# Patient Record
Sex: Male | Born: 1935 | Race: White | Hispanic: No | Smoking: Former smoker
Health system: Southern US, Community
[De-identification: ages and names within clinical notes are randomized; demographics above are authoritative.]

## PROBLEM LIST (undated history)

## (undated) DIAGNOSIS — K219 Gastro-esophageal reflux disease without esophagitis: Secondary | ICD-10-CM

---

## 2011-01-01 DEATH — deceased

## 2020-11-14 ENCOUNTER — Inpatient Hospital Stay (HOSPITAL_COMMUNITY)
Admission: EM | Admit: 2020-11-14 | Discharge: 2020-11-17 | DRG: 064 | Disposition: A | Discharge status: Hospice | Payer: Medicare PPO | Attending: Internal Medicine | Admitting: Internal Medicine

## 2020-11-14 ENCOUNTER — Other Ambulatory Visit: Payer: Self-pay

## 2020-11-14 ENCOUNTER — Encounter (HOSPITAL_COMMUNITY): Payer: Self-pay | Admitting: *Deleted

## 2020-11-14 ENCOUNTER — Emergency Department (HOSPITAL_COMMUNITY): Payer: Medicare PPO

## 2020-11-14 ENCOUNTER — Observation Stay (HOSPITAL_COMMUNITY): Payer: Medicare PPO

## 2020-11-14 DIAGNOSIS — R7989 Other specified abnormal findings of blood chemistry: Secondary | ICD-10-CM

## 2020-11-14 DIAGNOSIS — I63512 Cerebral infarction due to unspecified occlusion or stenosis of left middle cerebral artery: Principal | ICD-10-CM | POA: Diagnosis present

## 2020-11-14 DIAGNOSIS — K219 Gastro-esophageal reflux disease without esophagitis: Secondary | ICD-10-CM | POA: Diagnosis not present

## 2020-11-14 DIAGNOSIS — Z66 Do not resuscitate: Secondary | ICD-10-CM | POA: Diagnosis present

## 2020-11-14 DIAGNOSIS — R4701 Aphasia: Secondary | ICD-10-CM | POA: Diagnosis present

## 2020-11-14 DIAGNOSIS — Z87891 Personal history of nicotine dependence: Secondary | ICD-10-CM

## 2020-11-14 DIAGNOSIS — E669 Obesity, unspecified: Secondary | ICD-10-CM | POA: Diagnosis present

## 2020-11-14 DIAGNOSIS — I639 Cerebral infarction, unspecified: Secondary | ICD-10-CM | POA: Diagnosis not present

## 2020-11-14 DIAGNOSIS — R778 Other specified abnormalities of plasma proteins: Secondary | ICD-10-CM | POA: Diagnosis present

## 2020-11-14 DIAGNOSIS — G928 Other toxic encephalopathy: Secondary | ICD-10-CM | POA: Diagnosis present

## 2020-11-14 DIAGNOSIS — Z7982 Long term (current) use of aspirin: Secondary | ICD-10-CM

## 2020-11-14 DIAGNOSIS — Z6831 Body mass index (BMI) 31.0-31.9, adult: Secondary | ICD-10-CM

## 2020-11-14 DIAGNOSIS — F419 Anxiety disorder, unspecified: Secondary | ICD-10-CM | POA: Diagnosis present

## 2020-11-14 DIAGNOSIS — J439 Emphysema, unspecified: Secondary | ICD-10-CM | POA: Diagnosis present

## 2020-11-14 DIAGNOSIS — Z20822 Contact with and (suspected) exposure to covid-19: Secondary | ICD-10-CM | POA: Diagnosis present

## 2020-11-14 DIAGNOSIS — D6489 Other specified anemias: Secondary | ICD-10-CM | POA: Diagnosis present

## 2020-11-14 DIAGNOSIS — E785 Hyperlipidemia, unspecified: Secondary | ICD-10-CM | POA: Diagnosis present

## 2020-11-14 DIAGNOSIS — I248 Other forms of acute ischemic heart disease: Secondary | ICD-10-CM | POA: Diagnosis present

## 2020-11-14 DIAGNOSIS — Z515 Encounter for palliative care: Secondary | ICD-10-CM

## 2020-11-14 DIAGNOSIS — I6523 Occlusion and stenosis of bilateral carotid arteries: Secondary | ICD-10-CM

## 2020-11-14 DIAGNOSIS — R2990 Unspecified symptoms and signs involving the nervous system: Secondary | ICD-10-CM

## 2020-11-14 DIAGNOSIS — G8191 Hemiplegia, unspecified affecting right dominant side: Secondary | ICD-10-CM | POA: Diagnosis present

## 2020-11-14 DIAGNOSIS — I10 Essential (primary) hypertension: Secondary | ICD-10-CM | POA: Diagnosis present

## 2020-11-14 DIAGNOSIS — E119 Type 2 diabetes mellitus without complications: Secondary | ICD-10-CM | POA: Diagnosis present

## 2020-11-14 HISTORY — DX: Gastro-esophageal reflux disease without esophagitis: K21.9

## 2020-11-14 LAB — COMPREHENSIVE METABOLIC PANEL
ALT: 14 U/L (ref 0–44)
AST: 31 U/L (ref 15–41)
Albumin: 3.8 g/dL (ref 3.5–5.0)
Alkaline Phosphatase: 78 U/L (ref 38–126)
Anion gap: 12 (ref 5–15)
BUN: 20 mg/dL (ref 8–23)
CO2: 23 mmol/L (ref 22–32)
Calcium: 9.3 mg/dL (ref 8.9–10.3)
Chloride: 105 mmol/L (ref 98–111)
Creatinine, Ser: 1.63 mg/dL — ABNORMAL HIGH (ref 0.61–1.24)
GFR, Estimated: 41 mL/min — ABNORMAL LOW (ref >60)
Glucose, Bld: 123 mg/dL — ABNORMAL HIGH (ref 70–99)
Potassium: 4.1 mmol/L (ref 3.5–5.1)
Sodium: 140 mmol/L (ref 135–145)
Total Bilirubin: 1.2 mg/dL (ref 0.3–1.2)
Total Protein: 7.3 g/dL (ref 6.5–8.1)

## 2020-11-14 LAB — DIFFERENTIAL
Abs Immature Granulocytes: 0.02 10*3/uL (ref 0.00–0.07)
Basophils Absolute: 0 10*3/uL (ref 0.0–0.1)
Basophils Relative: 0 %
Eosinophils Absolute: 0.3 10*3/uL (ref 0.0–0.5)
Eosinophils Relative: 4 %
Immature Granulocytes: 0 %
Lymphocytes Relative: 28 %
Lymphs Abs: 2.4 10*3/uL (ref 0.7–4.0)
Monocytes Absolute: 0.7 10*3/uL (ref 0.1–1.0)
Monocytes Relative: 9 %
Neutro Abs: 5 10*3/uL (ref 1.7–7.7)
Neutrophils Relative %: 59 %

## 2020-11-14 LAB — URINALYSIS, ROUTINE W REFLEX MICROSCOPIC
Bilirubin Urine: NEGATIVE
Glucose, UA: NEGATIVE mg/dL
Ketones, ur: NEGATIVE mg/dL
Leukocytes,Ua: NEGATIVE
Nitrite: NEGATIVE
Protein, ur: 100 mg/dL — AB
Specific Gravity, Urine: >1.03 — ABNORMAL HIGH (ref 1.005–1.030)
pH: 5.5 (ref 5.0–8.0)

## 2020-11-14 LAB — APTT: aPTT: 37 seconds — ABNORMAL HIGH (ref 24–36)

## 2020-11-14 LAB — CBC
HCT: 46.8 % (ref 39.0–52.0)
Hemoglobin: 15.2 g/dL (ref 13.0–17.0)
MCH: 30.8 pg (ref 26.0–34.0)
MCHC: 32.5 g/dL (ref 30.0–36.0)
MCV: 94.7 fL (ref 80.0–100.0)
Platelets: 191 10*3/uL (ref 150–400)
RBC: 4.94 MIL/uL (ref 4.22–5.81)
RDW: 13.5 % (ref 11.5–15.5)
WBC: 8.4 10*3/uL (ref 4.0–10.5)
nRBC: 0 % (ref 0.0–0.2)

## 2020-11-14 LAB — I-STAT CHEM 8, ED
BUN: 23 mg/dL (ref 8–23)
Calcium, Ion: 1.08 mmol/L — ABNORMAL LOW (ref 1.15–1.40)
Chloride: 107 mmol/L (ref 98–111)
Creatinine, Ser: 1.5 mg/dL — ABNORMAL HIGH (ref 0.61–1.24)
Glucose, Bld: 112 mg/dL — ABNORMAL HIGH (ref 70–99)
HCT: 47 % (ref 39.0–52.0)
Hemoglobin: 16 g/dL (ref 13.0–17.0)
Potassium: 4 mmol/L (ref 3.5–5.1)
Sodium: 140 mmol/L (ref 135–145)
TCO2: 21 mmol/L — ABNORMAL LOW (ref 22–32)

## 2020-11-14 LAB — SODIUM, URINE, RANDOM: Sodium, Ur: 137 mmol/L

## 2020-11-14 LAB — TROPONIN I (HIGH SENSITIVITY)
Troponin I (High Sensitivity): 292 ng/L (ref <18)
Troponin I (High Sensitivity): 300 ng/L (ref <18)

## 2020-11-14 LAB — AMMONIA: Ammonia: 26 umol/L (ref 9–35)

## 2020-11-14 LAB — BRAIN NATRIURETIC PEPTIDE: B Natriuretic Peptide: 480.2 pg/mL — ABNORMAL HIGH (ref 0.0–100.0)

## 2020-11-14 LAB — URINALYSIS, MICROSCOPIC (REFLEX)
Bacteria, UA: NONE SEEN
Squamous Epithelial / HPF: NONE SEEN (ref 0–5)

## 2020-11-14 LAB — SARS CORONAVIRUS 2 (TAT 6-24 HRS): SARS Coronavirus 2: NEGATIVE

## 2020-11-14 LAB — CBG MONITORING, ED: Glucose-Capillary: 116 mg/dL — ABNORMAL HIGH (ref 70–99)

## 2020-11-14 LAB — PROTIME-INR
INR: 1.1 (ref 0.8–1.2)
Prothrombin Time: 13.8 seconds (ref 11.4–15.2)

## 2020-11-14 LAB — CREATININE, URINE, RANDOM: Creatinine, Urine: 217.07 mg/dL

## 2020-11-14 IMAGING — MR MR MRA HEAD W/O CM
2 series · 14 of 48 positions shown · non-contrast
Comparison: Noncontrast head CT performed earlier today [DATE].

CLINICAL DATA: Neuro deficit, acute, stroke suspected.

EXAM:
MR HEAD WITHOUT CONTRAST
MR CIRCLE OF WILLIS WITHOUT CONTRAST
MRA OF THE NECK WITHOUT CONTRAST
TECHNIQUE: Multiplanar, multiecho pulse sequences of the brain, circle of
willis and surrounding structures were obtained without intravenous
contrast. Angiographic images of the neck were obtained using MRA
technique without intravenous contrast.

[Series 5: 3d cow · axial · 0.5mm · 0.41mm/px · z∈[-53,+43]mm · 13 of 220 slices shown]
[im 1/220]
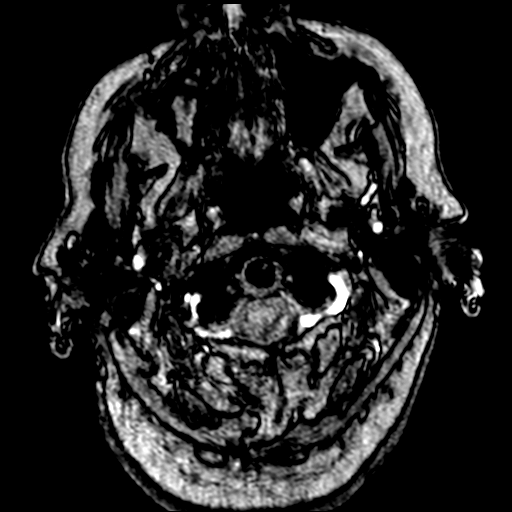
[im 5/220]
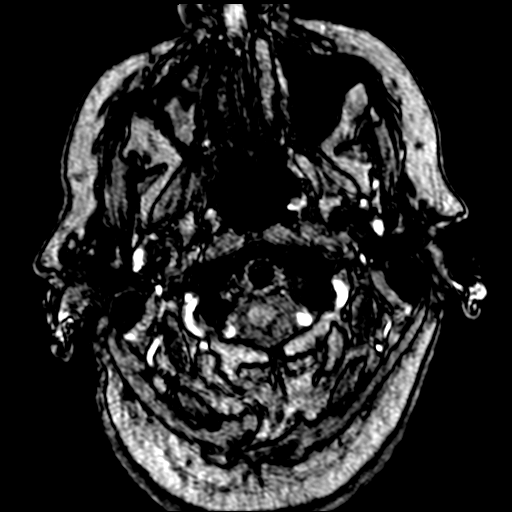
[im 10/220]
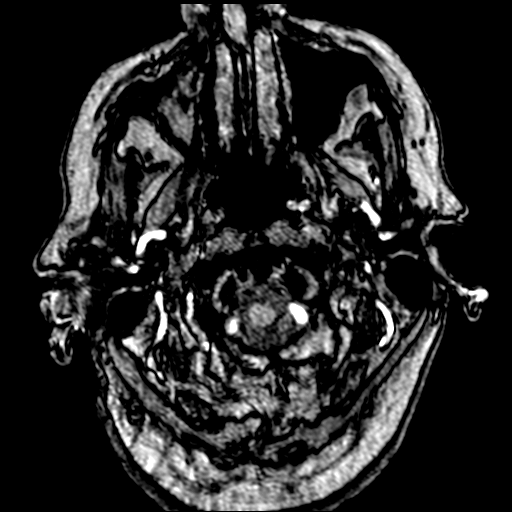
[im 34/220]
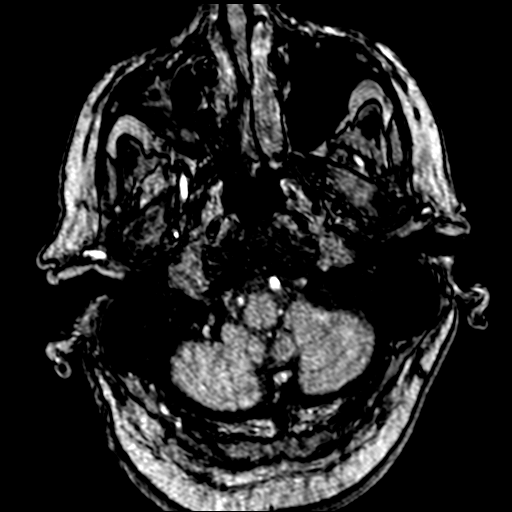
[im 39/220]
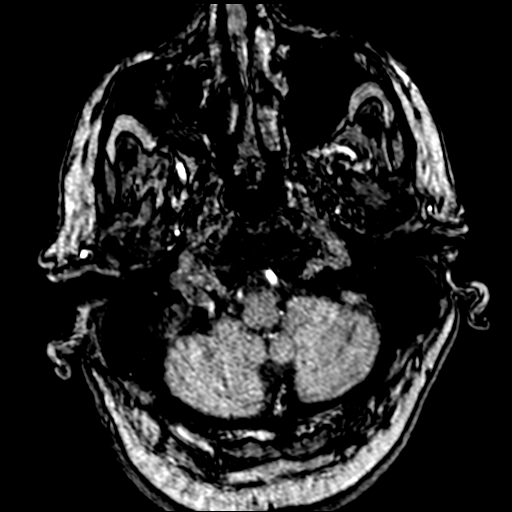
[im 67/220]
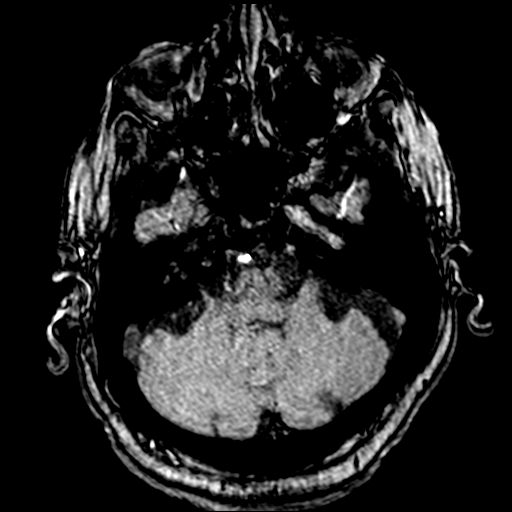
[im 96/220]
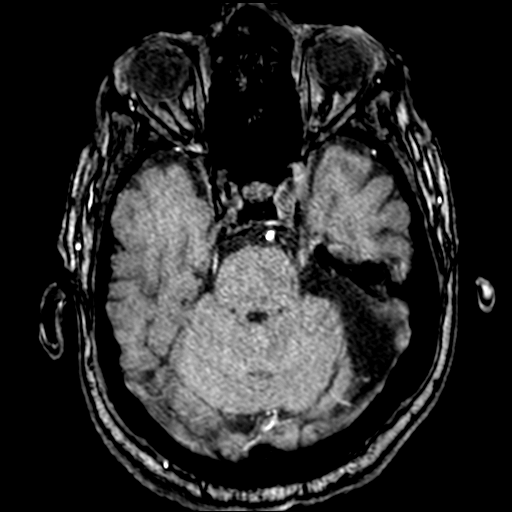
[im 110/220]
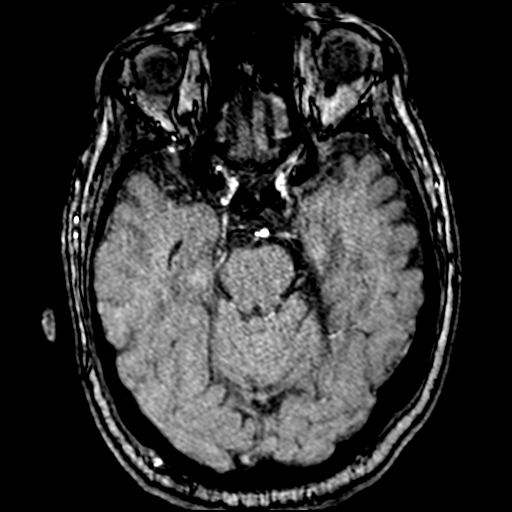
[im 124/220]
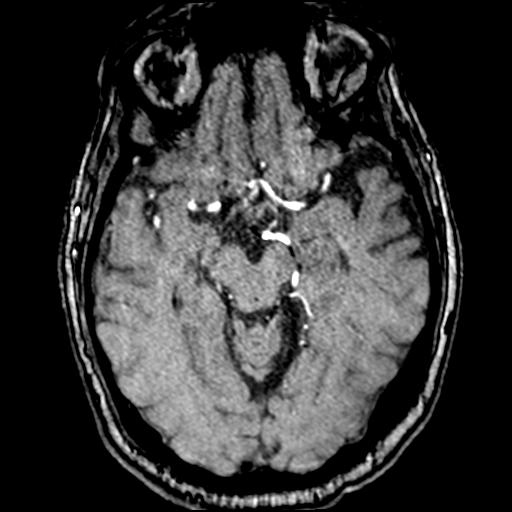
[im 153/220]
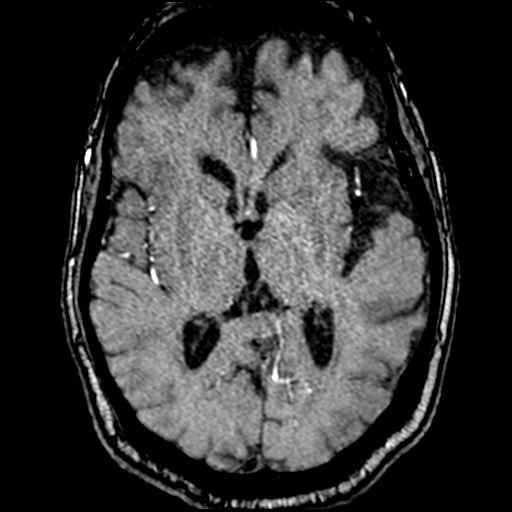
[im 181/220]
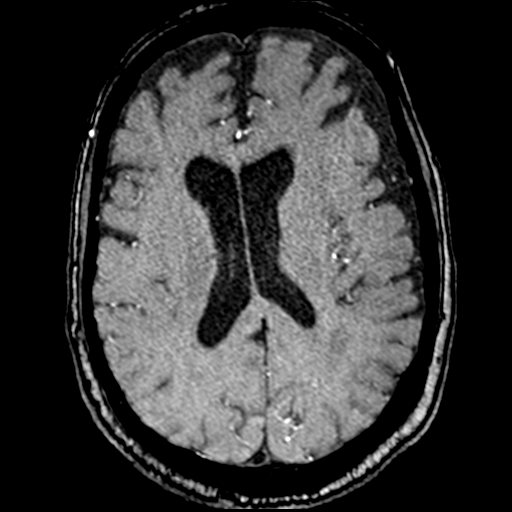
[im 186/220]
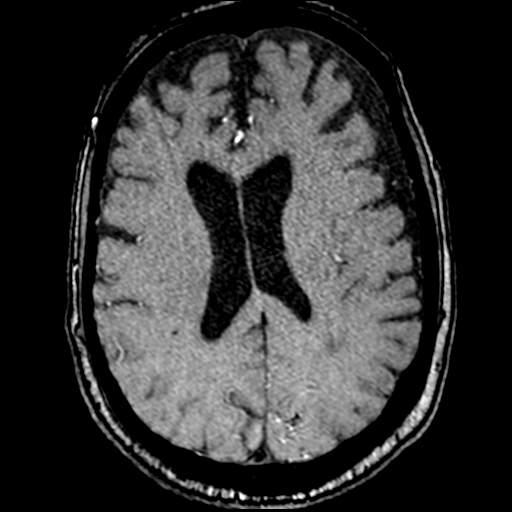
[im 210/220]
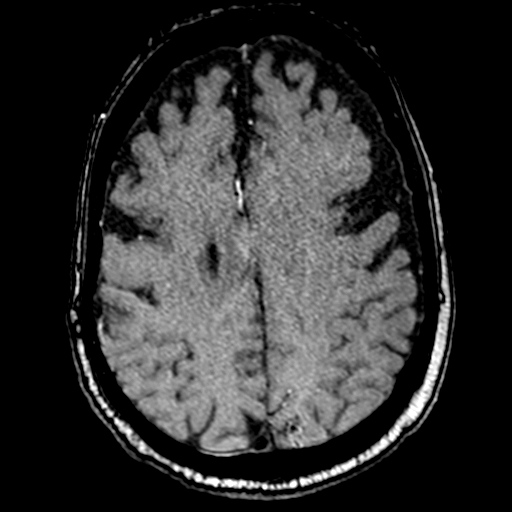

[Series 1060: mip tumble · axial · 0.5mm · 0.15mm/px · 1 of 1 slices shown]
[im 1/1]
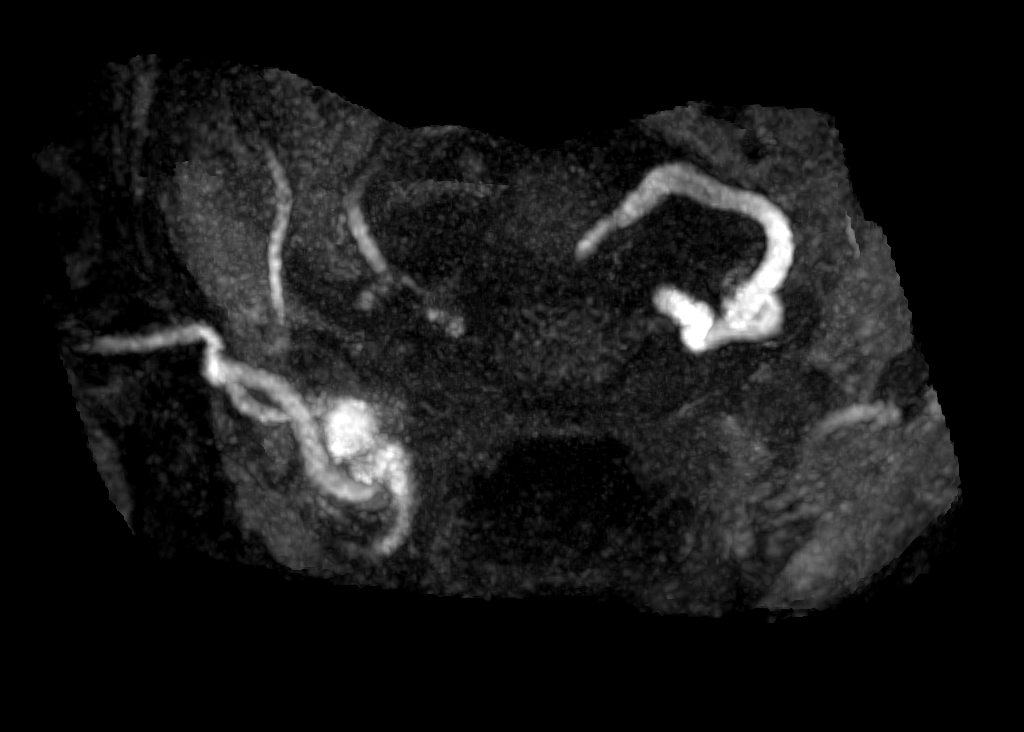

[14 of 48 positions shown; findings below may reference images not displayed]

FINDINGS: MR HEAD FINDINGS

Brain:

Intermittently motion degraded examination. Most notably, there is
mild-to-moderate motion degradation of the coronal T2 weighted
sequence.

Mild-to-moderate cerebral atrophy.

Small acute cortical infarct within the left parietal lobe (series
5, image 9).

Additional small curvilinear acute infarct within the left parietal
white matter (for instance as seen on series 5, image 85).

There are a few additional punctate acute infarcts within the left
frontal lobe white matter (for instance as seen on series 5, image
86).

Mild multifocal T2/FLAIR hyperintensity within the cerebral white
matter and pons is nonspecific, but compatible with chronic small
vessel ischemic disease.

Tiny chronic infarct within the right cerebellar hemisphere.

Redemonstrated asymmetric CSF intensity prominence overlying the
left frontal lobe. However, this appears to reflect asymmetric
prominence of the subarachnoid space rather than a subdural
collection. Unchanged 4 mm rightward midline shift, indeterminate in
etiology.

No evidence of intracranial mass.

No chronic intracranial blood products.

Vascular: Suspected left parietal lobe developmental venous anomaly
(series 18, image 41). Otherwise reported below.

Skull and upper cervical spine: No focal marrow lesion.

Sinuses/Orbits: Visualized orbits show no acute finding. Moderate
right maxillary sinus mucosal thickening with associated chronic
reactive osteitis. Trace bilateral ethmoid sinus mucosal thickening.

MR CIRCLE OF WILLIS FINDINGS

The intracranial internal carotid arteries remain occluded or near
occluded throughout the siphon region. Reconstitution of flow
related signal within the supraclinoid internal carotid arteries
bilaterally. The M1 middle cerebral arteries are patent. Attenuated
appearance of multiple proximal left M2 MCA branches. However, no
left M2 proximal branch occlusion is identified. The anterior
cerebral arteries are patent. Moderate stenosis within the A1 right
ACA. No intracranial aneurysm is identified.

The intracranial vertebral arteries are patent. The basilar artery
is patent. Possible short segment fenestration within the proximal
basilar artery. The posterior cerebral arteries are patent. Fetal
origin right posterior cerebral artery. A small left posterior
communicating artery is present.

MRA NECK FINDINGS

Mild intermittent motion degradation.

The right CCA is patent to the bifurcation without significant
stenosis. The right ICA is poorly delineated shortly beyond its
origin, likely occluded or near occluded. The left CCA is poorly
delineated shortly beyond its origin, likely occluded or near
occluded. The left ICA is nonvisualized, likely occluded or near
occluded.

The dominant left vertebral artery is patent within the neck.
Apparent moderate stenosis at the origin of this vessel. Flow
related signal is seen intermittently within the non dominant
cervical right vertebral artery.

These results were called by telephone at the time of interpretation
on [DATE] at [DATE] to provider Dr. KINOLLI, who verbally
acknowledged these results.
IMPRESSION: MRI brain:

1. Intermittently motion degraded examination.
2. Small acute infarcts within the left parietal cortex and left
frontoparietal white matter, as described.
3. Background mild-to-moderate cerebral atrophy and mild chronic
small vessel ischemic disease.
4. Tiny chronic right cerebellar infarct.
5. Redemonstrated asymmetric CSF intensity prominence overlying the
left frontal lobe. This appears to reflect asymmetric prominence of
the subarachnoid space rather than a subdural collection.

MRA neck:

1. The right ICA is poorly delineated shortly beyond its origin,
likely occluded or near occluded.
2. The left CCA is nonvisualized except for its very proximal
portion. The cervical left ICA is also nonvisualized. These vessels
are likely occluded or near occluded.
3. The dominant left vertebral artery is patent within the neck.
Apparent moderate stenosis at the origin of this vessel. Flow
related signal is seen intermittently within the non-dominant
cervical right vertebral artery.

MRA head:

1. The intracranial internal carotid arteries remain occluded or
near occluded throughout the siphon region. Reconstitution of the
intracranial ICAs at the supraclinoid level.
2. Attenuated appearance of multiple proximal M2 left MCA branches.
However, no left M2 proximal branch occlusion is identified.
3. Moderate stenosis within the A1 right ACA.

## 2020-11-14 IMAGING — MR MR HEAD W/O CM
12 of 13 series · 44 of 48 positions shown · non-contrast
Comparison: Noncontrast head CT performed earlier today [DATE].

CLINICAL DATA: Neuro deficit, acute, stroke suspected.

EXAM:
MR HEAD WITHOUT CONTRAST
MR CIRCLE OF WILLIS WITHOUT CONTRAST
MRA OF THE NECK WITHOUT CONTRAST
TECHNIQUE: Multiplanar, multiecho pulse sequences of the brain, circle of
willis and surrounding structures were obtained without intravenous
contrast. Angiographic images of the neck were obtained using MRA
technique without intravenous contrast.

[Series 5: DWI · axial · 3.0mm · 0.88mm/px · z∈[-45,+91]mm · 8 of 104 slices shown (1 of 4)]
[im 1/104]
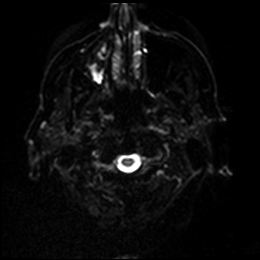
[im 15/104]
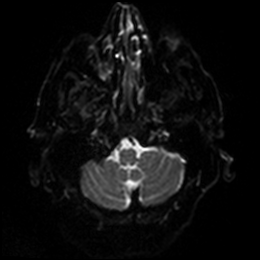
[im 30/104]
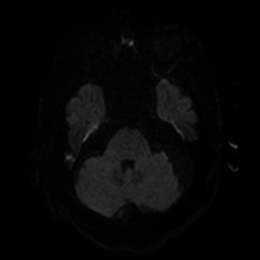
[im 45/104]
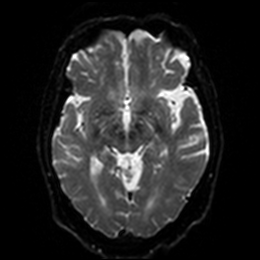
[im 59/104]
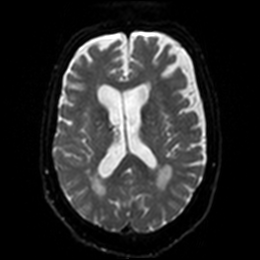
[im 74/104]
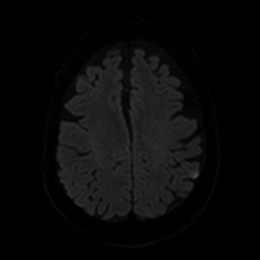
[im 89/104]
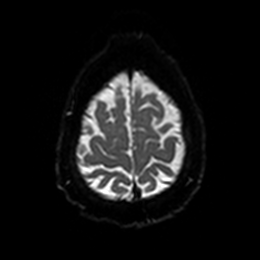
[im 104/104]
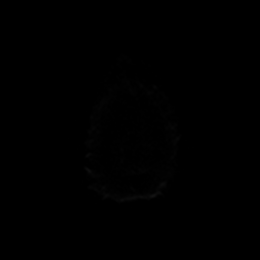

[Series 6: DWI · axial · 3.0mm · 0.88mm/px · z∈[-45,+91]mm · 4 of 52 slices shown (2 of 4)]
[im 1/52]
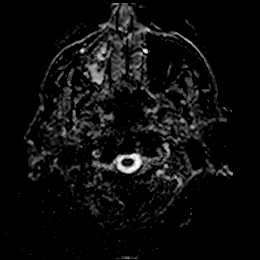
[im 18/52]
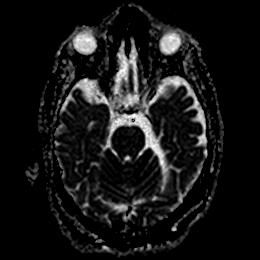
[im 35/52]
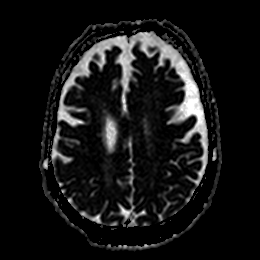
[im 52/52]
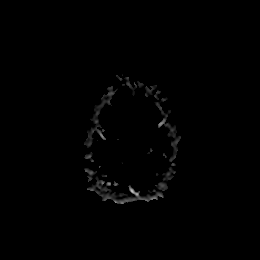

[Series 7: T1 · sagittal · 5.0mm · 0.75mm/px · 2 of 27 slices shown]
[im 1/27]
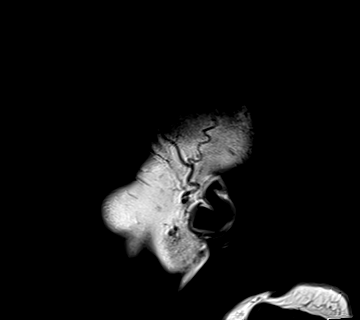
[im 27/27]
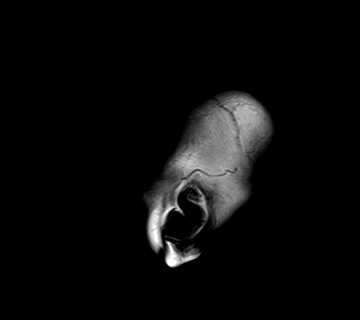

[Series 8: DWI · coronal · 4.0mm · 0.88mm/px · 5 of 72 slices shown (3 of 4)]
[im 1/72]
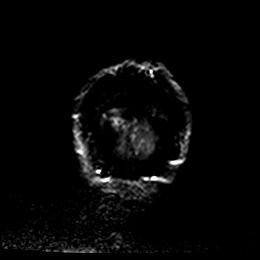
[im 18/72]
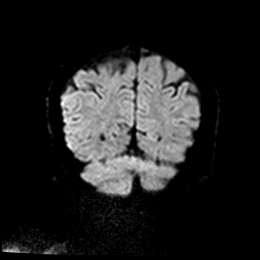
[im 36/72]
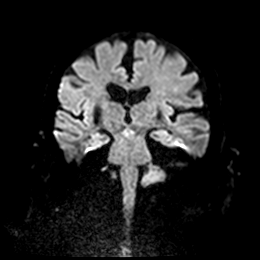
[im 54/72]
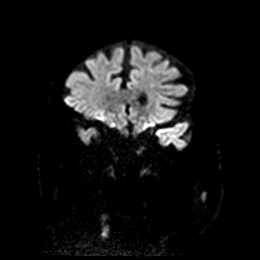
[im 72/72]
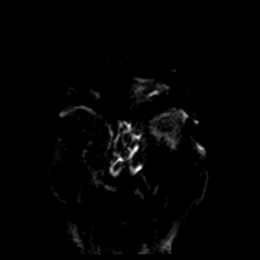

[Series 9: DWI · coronal · 4.0mm · 0.88mm/px · 3 of 36 slices shown (4 of 4)]
[im 1/36]
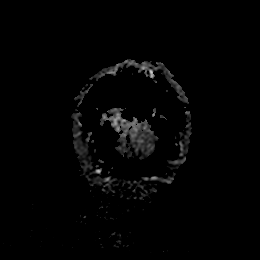
[im 18/36]
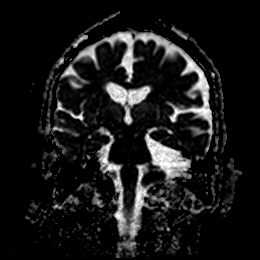
[im 36/36]
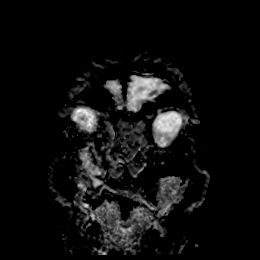

[Series 10: T2 · axial · 5.0mm · 0.72mm/px · z∈[-43,+96]mm · 2 of 27 slices shown (1 of 2)]
[im 1/27]
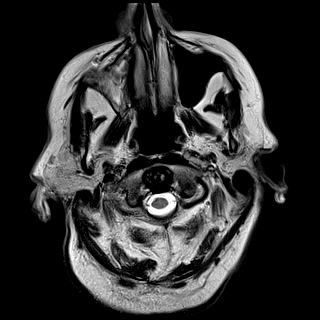
[im 27/27]
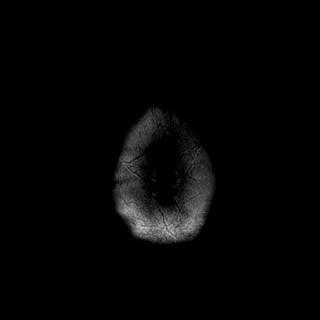

[Series 11: FLAIR · axial · 5.0mm · 0.45mm/px · z∈[-41,+98]mm · 2 of 27 slices shown]
[im 1/27]
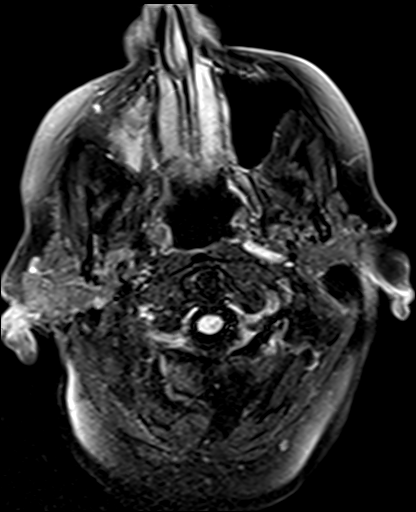
[im 27/27]
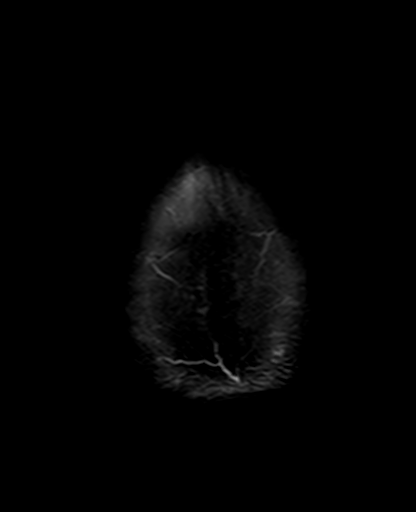

[Series 16: mag_images · axial · 3.0mm · 0.90mm/px · z∈[-46,+101]mm · 4 of 56 slices shown]
[im 1/56]
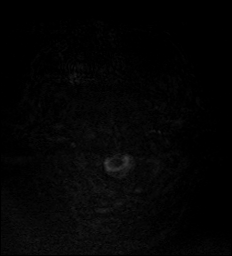
[im 19/56]
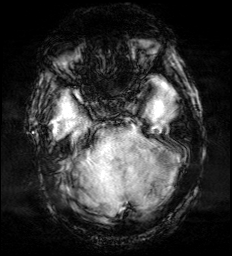
[im 37/56]
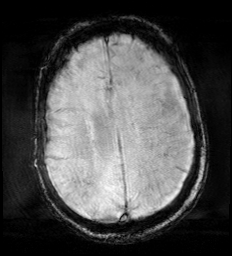
[im 56/56]
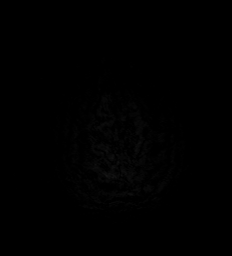

[Series 17: pha_images · axial · 3.0mm · 0.90mm/px · z∈[-46,+101]mm · 4 of 55 slices shown]
[im 1/55]
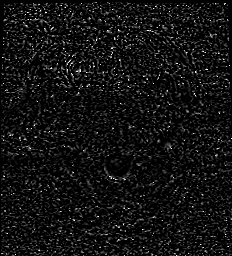
[im 19/55]
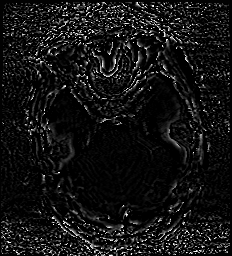
[im 37/55]
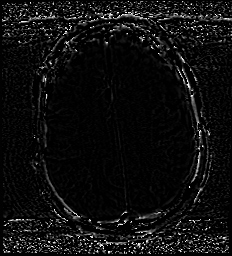
[im 55/55]
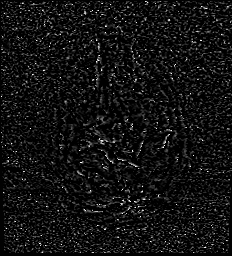

[Series 18: swi_images · axial · 3.0mm · 0.90mm/px · z∈[-46,+101]mm · 4 of 56 slices shown]
[im 1/56]
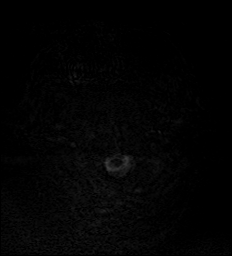
[im 19/56]
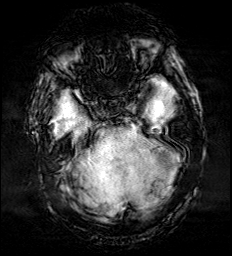
[im 37/56]
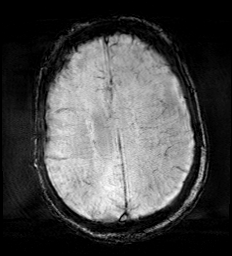
[im 56/56]
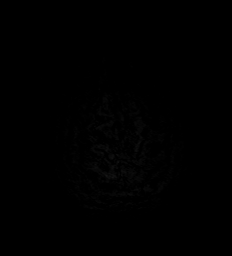

[Series 19: mip_images(sw) · axial · 24.0mm · 0.90mm/px · z∈[-37,+92]mm · 4 of 49 slices shown]
[im 1/49]
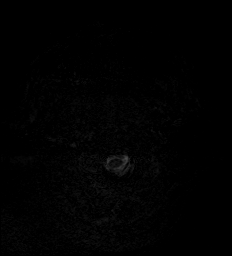
[im 17/49]
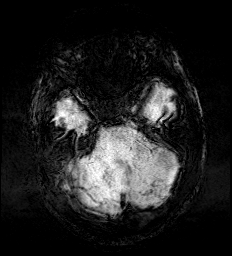
[im 33/49]
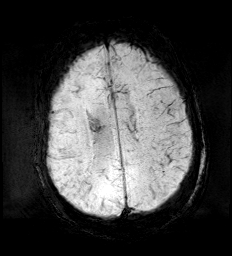
[im 49/49]
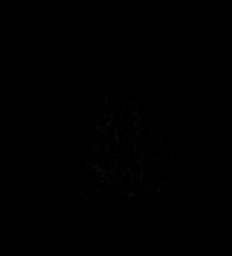

[Series 21: T2 · coronal · 5.0mm · 0.72mm/px · 2 of 33 slices shown (2 of 2)]
[im 1/33]
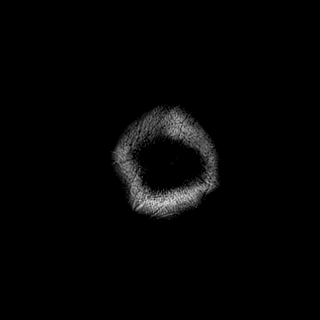
[im 33/33]
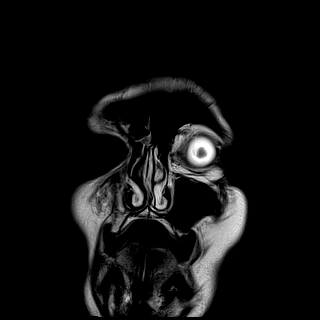

[44 of 48 positions shown; findings below may reference images not displayed]

FINDINGS: MR HEAD FINDINGS

Brain:

Intermittently motion degraded examination. Most notably, there is
mild-to-moderate motion degradation of the coronal T2 weighted
sequence.

Mild-to-moderate cerebral atrophy.

Small acute cortical infarct within the left parietal lobe (series
5, image 9).

Additional small curvilinear acute infarct within the left parietal
white matter (for instance as seen on series 5, image 85).

There are a few additional punctate acute infarcts within the left
frontal lobe white matter (for instance as seen on series 5, image
86).

Mild multifocal T2/FLAIR hyperintensity within the cerebral white
matter and pons is nonspecific, but compatible with chronic small
vessel ischemic disease.

Tiny chronic infarct within the right cerebellar hemisphere.

Redemonstrated asymmetric CSF intensity prominence overlying the
left frontal lobe. However, this appears to reflect asymmetric
prominence of the subarachnoid space rather than a subdural
collection. Unchanged 4 mm rightward midline shift, indeterminate in
etiology.

No evidence of intracranial mass.

No chronic intracranial blood products.

Vascular: Suspected left parietal lobe developmental venous anomaly
(series 18, image 41). Otherwise reported below.

Skull and upper cervical spine: No focal marrow lesion.

Sinuses/Orbits: Visualized orbits show no acute finding. Moderate
right maxillary sinus mucosal thickening with associated chronic
reactive osteitis. Trace bilateral ethmoid sinus mucosal thickening.

MR CIRCLE OF WILLIS FINDINGS

The intracranial internal carotid arteries remain occluded or near
occluded throughout the siphon region. Reconstitution of flow
related signal within the supraclinoid internal carotid arteries
bilaterally. The M1 middle cerebral arteries are patent. Attenuated
appearance of multiple proximal left M2 MCA branches. However, no
left M2 proximal branch occlusion is identified. The anterior
cerebral arteries are patent. Moderate stenosis within the A1 right
ACA. No intracranial aneurysm is identified.

The intracranial vertebral arteries are patent. The basilar artery
is patent. Possible short segment fenestration within the proximal
basilar artery. The posterior cerebral arteries are patent. Fetal
origin right posterior cerebral artery. A small left posterior
communicating artery is present.

MRA NECK FINDINGS

Mild intermittent motion degradation.

The right CCA is patent to the bifurcation without significant
stenosis. The right ICA is poorly delineated shortly beyond its
origin, likely occluded or near occluded. The left CCA is poorly
delineated shortly beyond its origin, likely occluded or near
occluded. The left ICA is nonvisualized, likely occluded or near
occluded.

The dominant left vertebral artery is patent within the neck.
Apparent moderate stenosis at the origin of this vessel. Flow
related signal is seen intermittently within the non dominant
cervical right vertebral artery.

These results were called by telephone at the time of interpretation
on [DATE] at [DATE] to provider Dr. KINOLLI, who verbally
acknowledged these results.
IMPRESSION: MRI brain:

1. Intermittently motion degraded examination.
2. Small acute infarcts within the left parietal cortex and left
frontoparietal white matter, as described.
3. Background mild-to-moderate cerebral atrophy and mild chronic
small vessel ischemic disease.
4. Tiny chronic right cerebellar infarct.
5. Redemonstrated asymmetric CSF intensity prominence overlying the
left frontal lobe. This appears to reflect asymmetric prominence of
the subarachnoid space rather than a subdural collection.

MRA neck:

1. The right ICA is poorly delineated shortly beyond its origin,
likely occluded or near occluded.
2. The left CCA is nonvisualized except for its very proximal
portion. The cervical left ICA is also nonvisualized. These vessels
are likely occluded or near occluded.
3. The dominant left vertebral artery is patent within the neck.
Apparent moderate stenosis at the origin of this vessel. Flow
related signal is seen intermittently within the non-dominant
cervical right vertebral artery.

MRA head:

1. The intracranial internal carotid arteries remain occluded or
near occluded throughout the siphon region. Reconstitution of the
intracranial ICAs at the supraclinoid level.
2. Attenuated appearance of multiple proximal M2 left MCA branches.
However, no left M2 proximal branch occlusion is identified.
3. Moderate stenosis within the A1 right ACA.

## 2020-11-14 IMAGING — MR MR MRA NECK W/O CM
5 of 9 series · 32 of 48 positions shown · non-contrast
Comparison: Noncontrast head CT performed earlier today [DATE].

CLINICAL DATA: Neuro deficit, acute, stroke suspected.

EXAM:
MR HEAD WITHOUT CONTRAST
MR CIRCLE OF WILLIS WITHOUT CONTRAST
MRA OF THE NECK WITHOUT CONTRAST
TECHNIQUE: Multiplanar, multiecho pulse sequences of the brain, circle of
willis and surrounding structures were obtained without intravenous
contrast. Angiographic images of the neck were obtained using MRA
technique without intravenous contrast.

[Series 18: tof_fl2d_tra_mip_tra · axial · 240.6mm · 0.78mm/px · 1 of 1 slices shown]
[im 1/1]
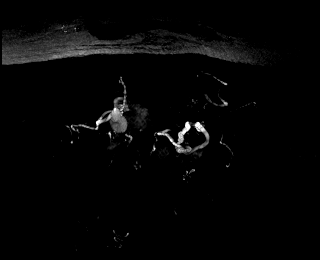

[Series 21: tof_fl2d_tra_mip_radials · coronal · 0.78mm/px · 1 of 8 slices shown]
[im 1/8]
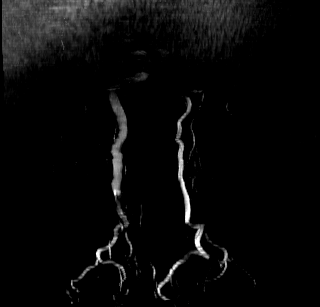

[Series 22: T1 fat-sat · axial · 2.0mm · 0.39mm/px · z∈[-151,-87]mm · 4 of 30 slices shown]
[im 1/30]
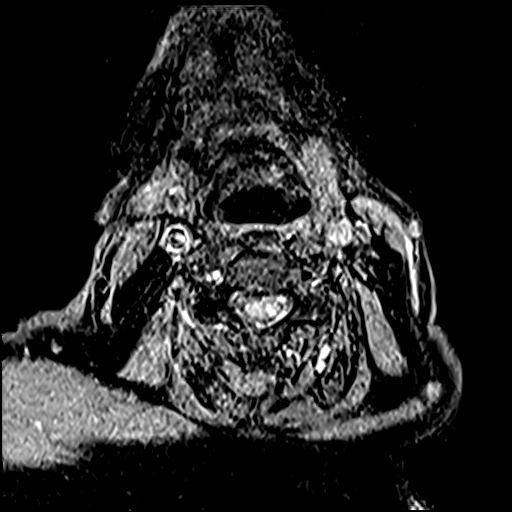
[im 10/30]
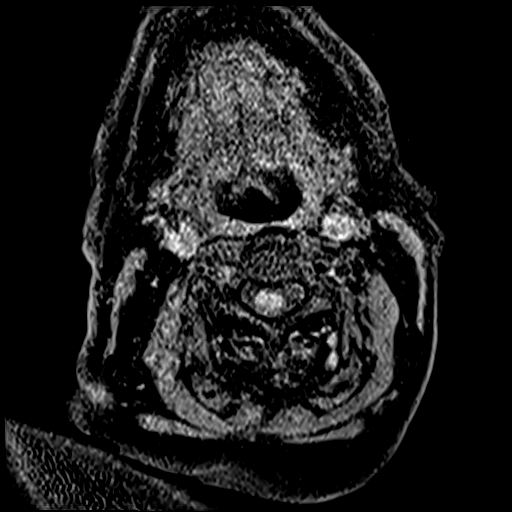
[im 20/30]
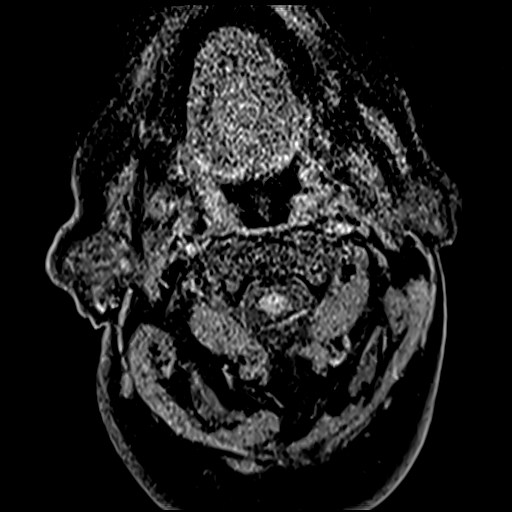
[im 30/30]
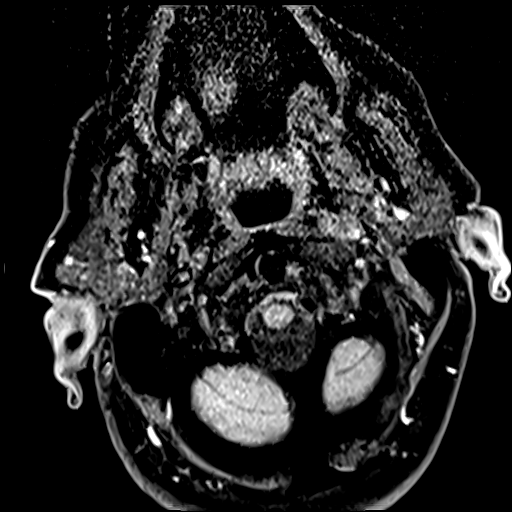

[Series 28: tof_fl3d_tra_iso · axial · 0.6mm · 0.52mm/px · z∈[-144,-70]mm · 18 of 133 slices shown]
[im 1/133]
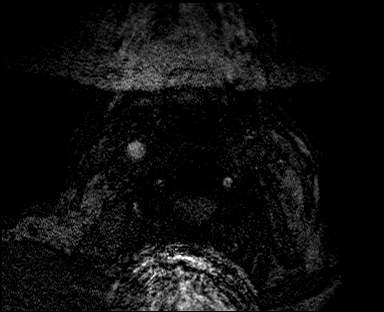
[im 7/133]
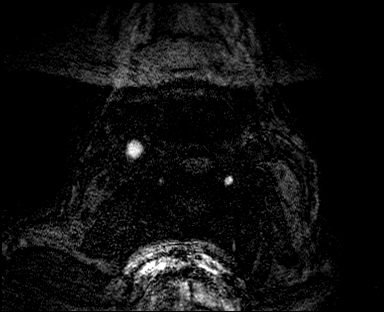
[im 14/133]
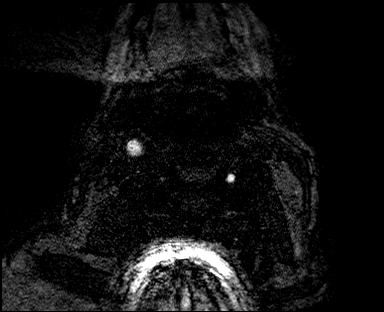
[im 20/133]
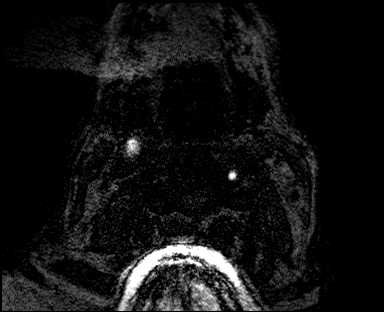
[im 27/133]
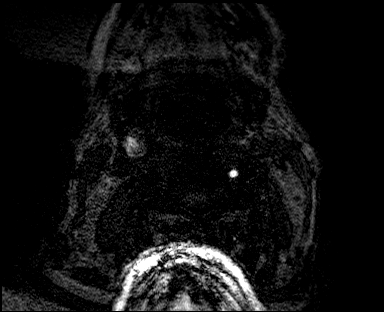
[im 34/133]
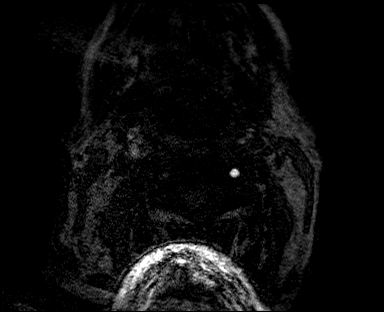
[im 40/133]
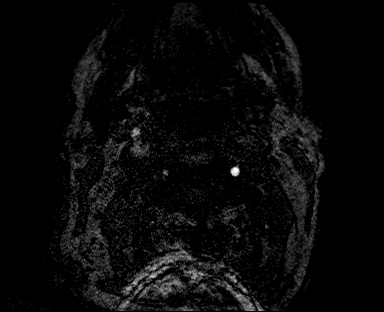
[im 47/133]
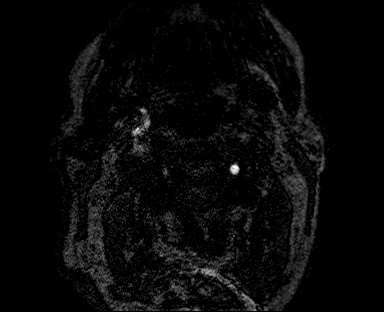
[im 53/133]
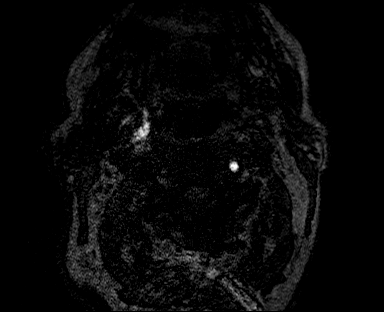
[im 60/133]
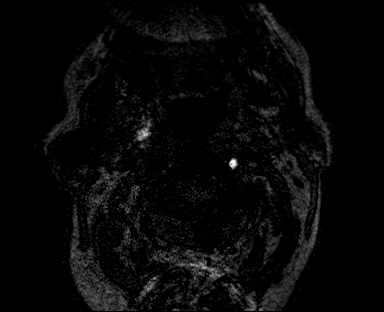
[im 67/133]
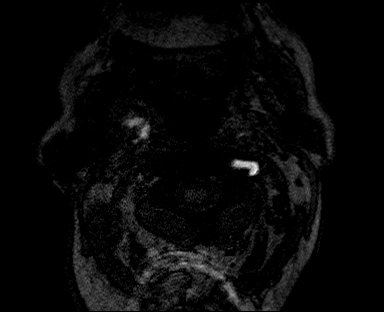
[im 73/133]
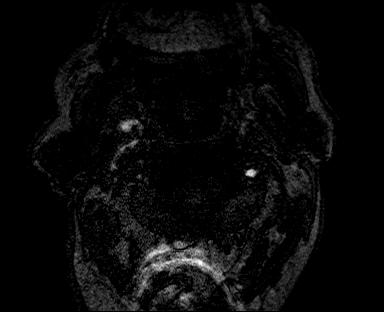
[im 80/133]
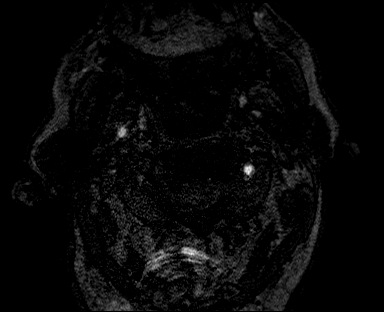
[im 86/133]
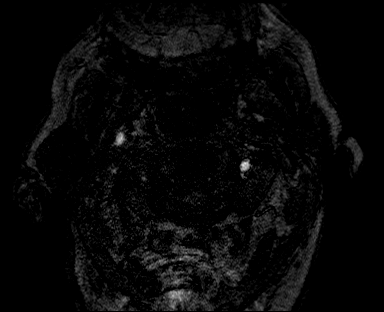
[im 93/133]
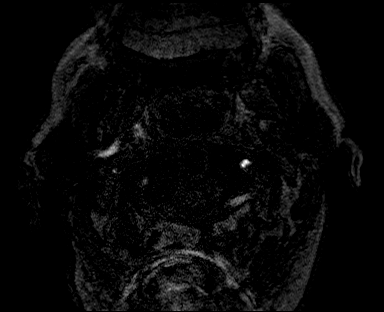
[im 100/133]
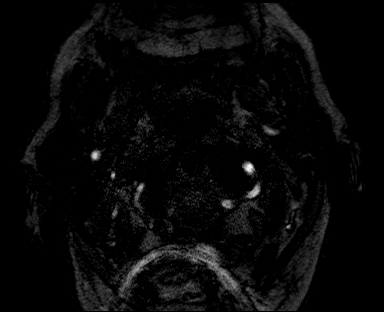
[im 113/133]
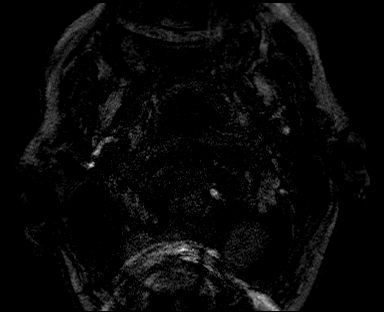
[im 126/133]
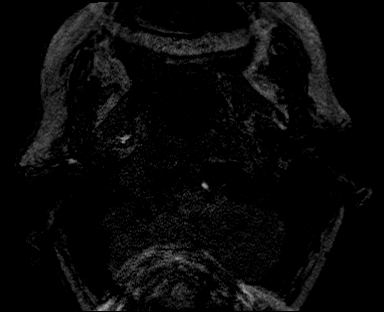

[Series 31: tof_fl2d_tra · axial · 3.0mm · 0.78mm/px · z∈[-280,-60]mm · 8 of 100 slices shown]
[im 7/100]
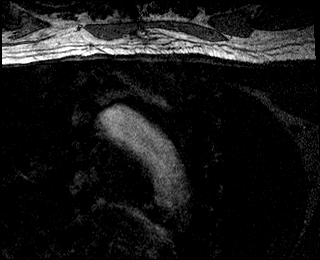
[im 20/100]
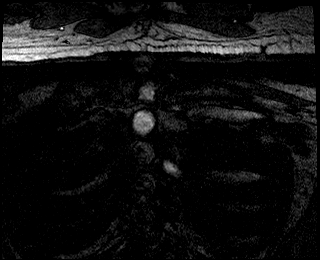
[im 34/100]
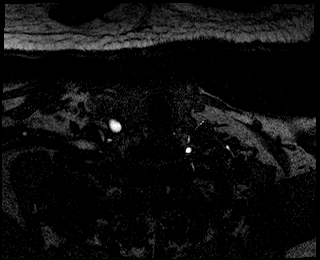
[im 47/100]
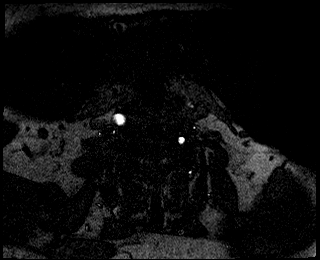
[im 60/100]
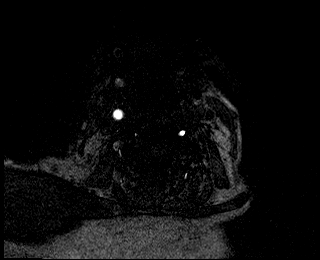
[im 73/100]
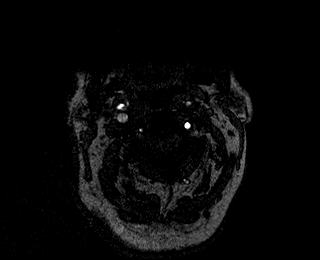
[im 86/100]
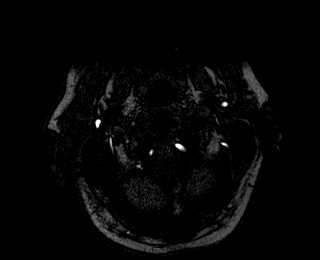
[im 100/100]
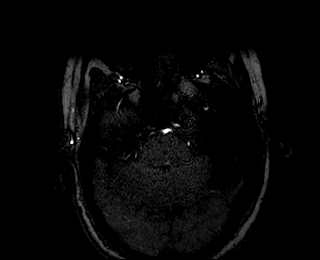

[32 of 48 positions shown; findings below may reference images not displayed]

FINDINGS: MR HEAD FINDINGS

Brain:

Intermittently motion degraded examination. Most notably, there is
mild-to-moderate motion degradation of the coronal T2 weighted
sequence.

Mild-to-moderate cerebral atrophy.

Small acute cortical infarct within the left parietal lobe (series
5, image 9).

Additional small curvilinear acute infarct within the left parietal
white matter (for instance as seen on series 5, image 85).

There are a few additional punctate acute infarcts within the left
frontal lobe white matter (for instance as seen on series 5, image
86).

Mild multifocal T2/FLAIR hyperintensity within the cerebral white
matter and pons is nonspecific, but compatible with chronic small
vessel ischemic disease.

Tiny chronic infarct within the right cerebellar hemisphere.

Redemonstrated asymmetric CSF intensity prominence overlying the
left frontal lobe. However, this appears to reflect asymmetric
prominence of the subarachnoid space rather than a subdural
collection. Unchanged 4 mm rightward midline shift, indeterminate in
etiology.

No evidence of intracranial mass.

No chronic intracranial blood products.

Vascular: Suspected left parietal lobe developmental venous anomaly
(series 18, image 41). Otherwise reported below.

Skull and upper cervical spine: No focal marrow lesion.

Sinuses/Orbits: Visualized orbits show no acute finding. Moderate
right maxillary sinus mucosal thickening with associated chronic
reactive osteitis. Trace bilateral ethmoid sinus mucosal thickening.

MR CIRCLE OF WILLIS FINDINGS

The intracranial internal carotid arteries remain occluded or near
occluded throughout the siphon region. Reconstitution of flow
related signal within the supraclinoid internal carotid arteries
bilaterally. The M1 middle cerebral arteries are patent. Attenuated
appearance of multiple proximal left M2 MCA branches. However, no
left M2 proximal branch occlusion is identified. The anterior
cerebral arteries are patent. Moderate stenosis within the A1 right
ACA. No intracranial aneurysm is identified.

The intracranial vertebral arteries are patent. The basilar artery
is patent. Possible short segment fenestration within the proximal
basilar artery. The posterior cerebral arteries are patent. Fetal
origin right posterior cerebral artery. A small left posterior
communicating artery is present.

MRA NECK FINDINGS

Mild intermittent motion degradation.

The right CCA is patent to the bifurcation without significant
stenosis. The right ICA is poorly delineated shortly beyond its
origin, likely occluded or near occluded. The left CCA is poorly
delineated shortly beyond its origin, likely occluded or near
occluded. The left ICA is nonvisualized, likely occluded or near
occluded.

The dominant left vertebral artery is patent within the neck.
Apparent moderate stenosis at the origin of this vessel. Flow
related signal is seen intermittently within the non dominant
cervical right vertebral artery.

These results were called by telephone at the time of interpretation
on [DATE] at [DATE] to provider Dr. KINOLLI, who verbally
acknowledged these results.
IMPRESSION: MRI brain:

1. Intermittently motion degraded examination.
2. Small acute infarcts within the left parietal cortex and left
frontoparietal white matter, as described.
3. Background mild-to-moderate cerebral atrophy and mild chronic
small vessel ischemic disease.
4. Tiny chronic right cerebellar infarct.
5. Redemonstrated asymmetric CSF intensity prominence overlying the
left frontal lobe. This appears to reflect asymmetric prominence of
the subarachnoid space rather than a subdural collection.

MRA neck:

1. The right ICA is poorly delineated shortly beyond its origin,
likely occluded or near occluded.
2. The left CCA is nonvisualized except for its very proximal
portion. The cervical left ICA is also nonvisualized. These vessels
are likely occluded or near occluded.
3. The dominant left vertebral artery is patent within the neck.
Apparent moderate stenosis at the origin of this vessel. Flow
related signal is seen intermittently within the non-dominant
cervical right vertebral artery.

MRA head:

1. The intracranial internal carotid arteries remain occluded or
near occluded throughout the siphon region. Reconstitution of the
intracranial ICAs at the supraclinoid level.
2. Attenuated appearance of multiple proximal M2 left MCA branches.
However, no left M2 proximal branch occlusion is identified.
3. Moderate stenosis within the A1 right ACA.

## 2020-11-14 IMAGING — CT CT HEAD CODE STROKE
3 series · 14 of 47 positions shown, 16 images · non-contrast
Comparison: No pertinent prior exams available for comparison.

CLINICAL DATA: Code stroke. Neuro deficit, acute, stroke suspected.
Additional history provided: Left eye blurriness, right arm sensory
and coordination.

EXAM:
CT HEAD WITHOUT CONTRAST
TECHNIQUE: Contiguous axial images were obtained from the base of the skull
through the vertex without intravenous contrast.

[Series 2: head 5.0 st · axial · 0.52mm/px · z∈[-85,+65]mm · 8 of 36 slices shown, 10 images]
[im 3/36  brain]
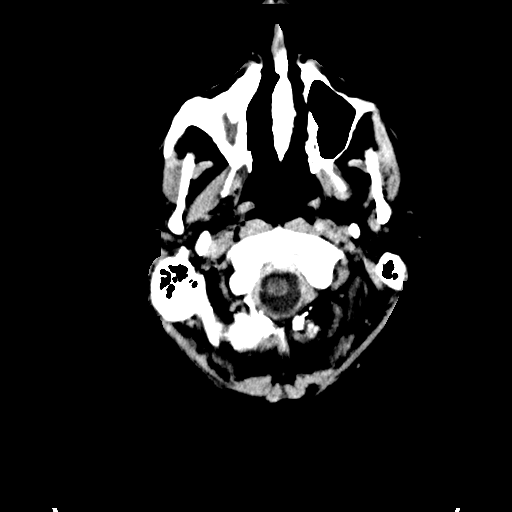
[im 3/36  bone]
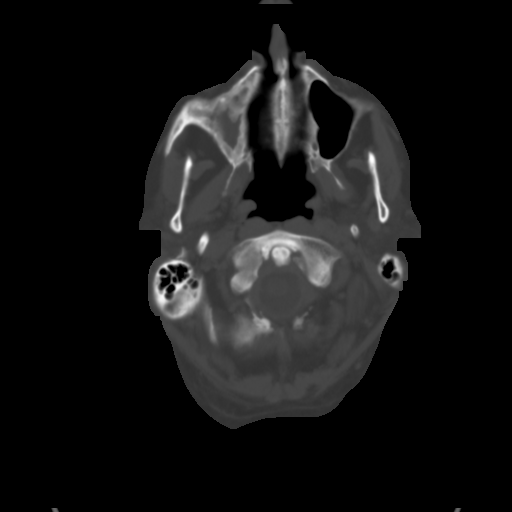
[im 8/36  brain]
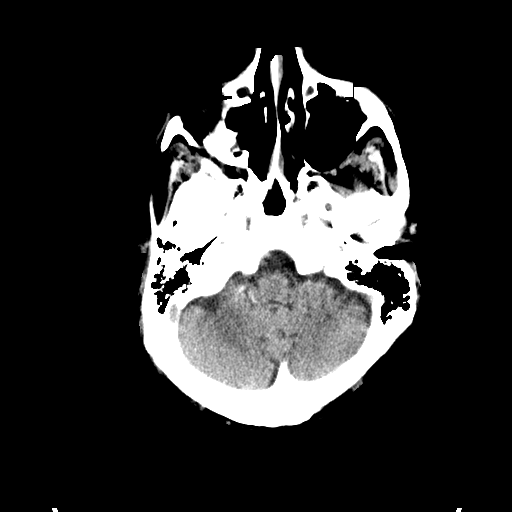
[im 11/36  brain]
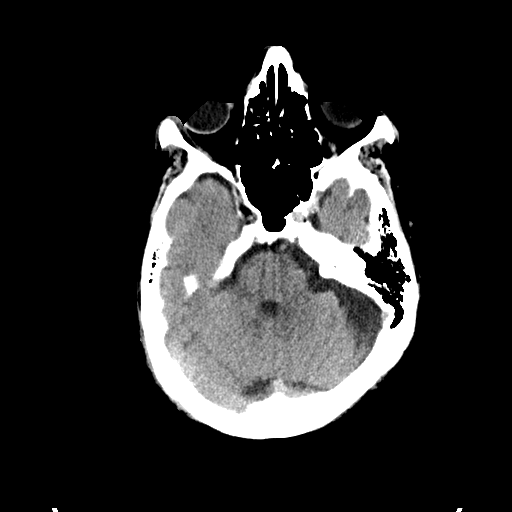
[im 16/36  brain]
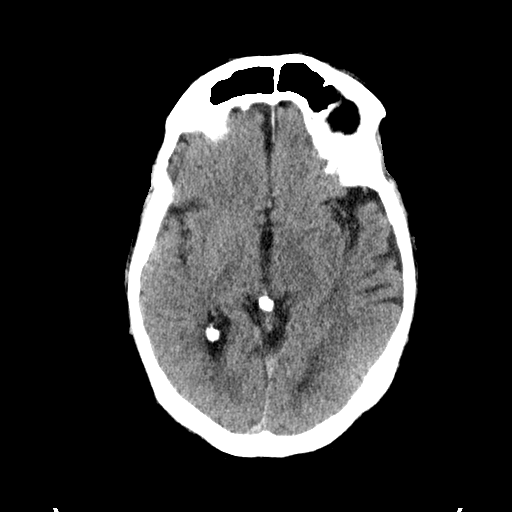
[im 20/36  brain]
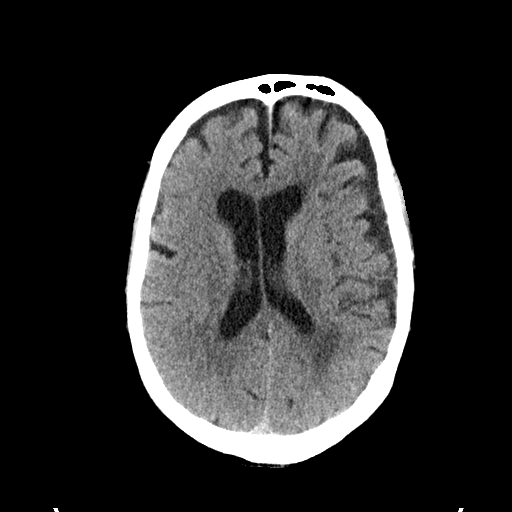
[im 20/36  bone]
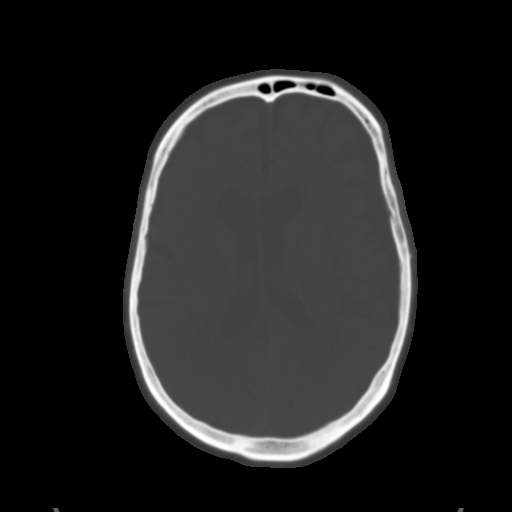
[im 25/36  brain]
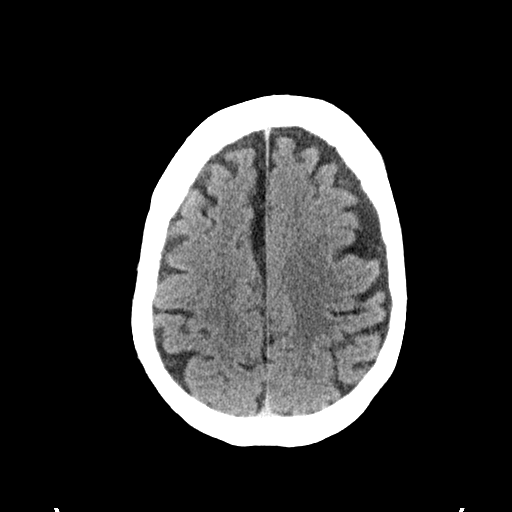
[im 28/36  brain]
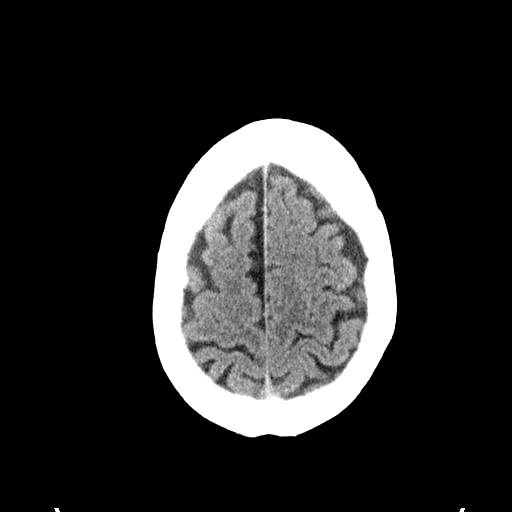
[im 33/36  brain]
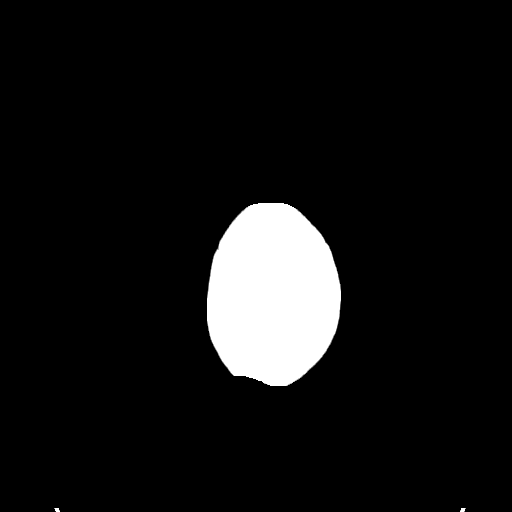

[Series 5: head 3.0 cor st · coronal · 0.34mm/px · 3 of 81 slices shown]
[im 27/81  brain]
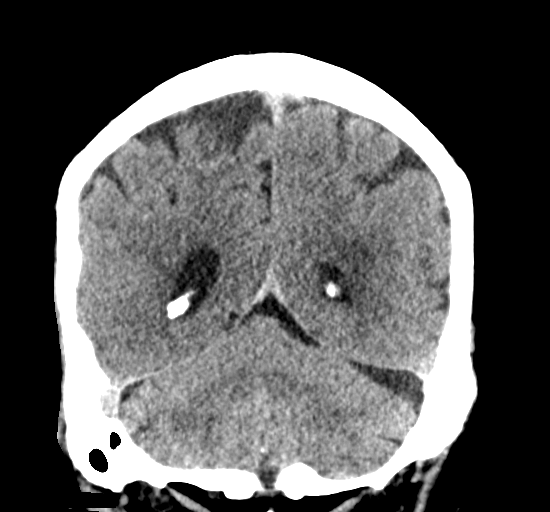
[im 36/81  brain]
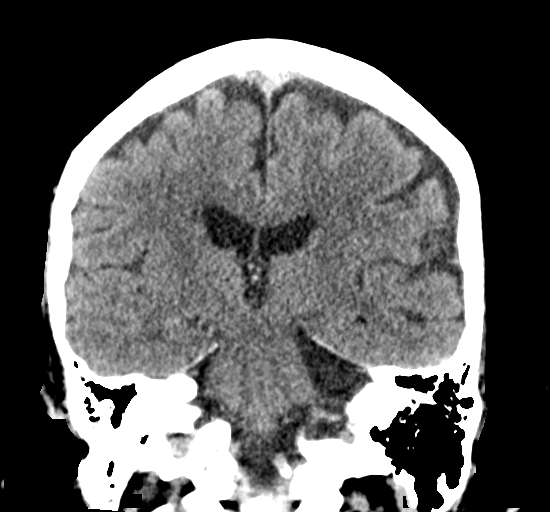
[im 45/81  brain]
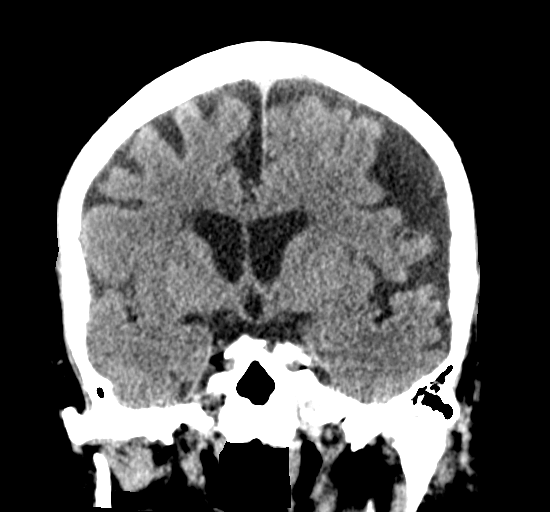

[Series 6: head 3.0 sag st · sagittal · 0.36mm/px · 3 of 60 slices shown]
[im 20/60  brain]
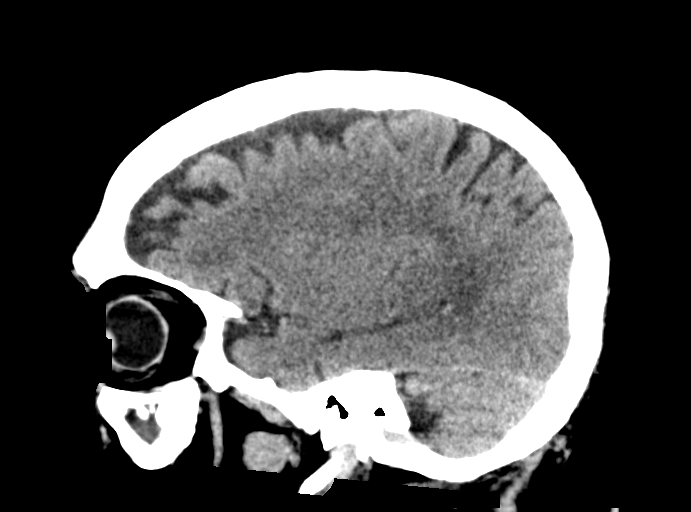
[im 30/60  brain]
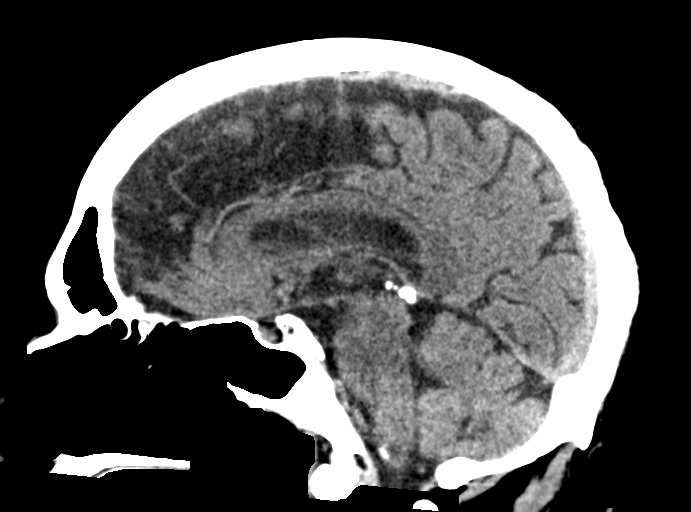
[im 40/60  brain]
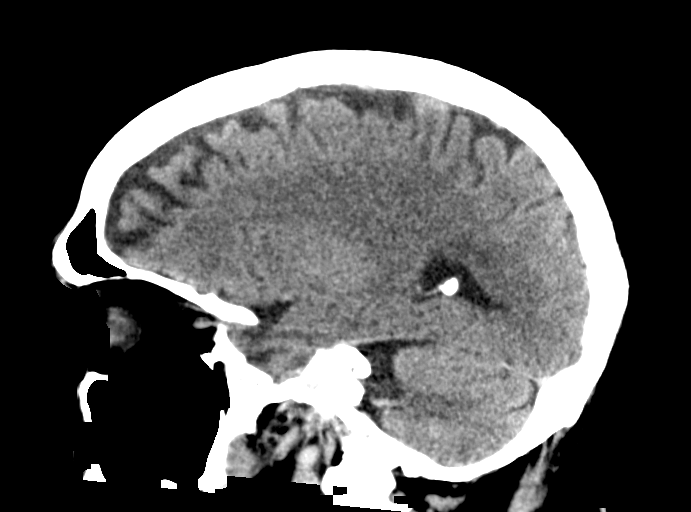

[14 of 47 positions shown; findings below may reference images not displayed]

FINDINGS: Brain:

Mild cerebral atrophy.

Asymmetric extra-axial CSF density overlying the left cerebral
hemisphere measuring up to 6 mm in thickness. This may be related to
patient head positioning at the time of examination. A chronic
subdural hematoma or subdural hygroma cannot be excluded. 4 mm
rightward midline shift. Again, this may be related to patient head
positioning or mass effect from a left subdural collection.

Mild ill-defined hypoattenuation within the cerebral white matter,
nonspecific, but compatible with chronic small vessel ischemic
disease.

There is no acute intracranial hemorrhage.

No demarcated cortical infarct.

No evidence of intracranial mass.

No midline shift.

Vascular: No hyperdense vessel.  Atherosclerotic calcifications.

Skull: Normal. Negative for fracture or focal lesion.

Sinuses/Orbits: Visualized orbits show no acute finding. Moderate
mucosal thickening within the right maxillary sinus with associated
chronic reactive osteitis. Mild right frontal and bilateral ethmoid
sinus mucosal thickening.

ASPECTS ([place] Stroke Program Early CT Score)

- Ganglionic level infarction (caudate, lentiform nuclei, internal
capsule, insula, M1-M3 cortex): 7

- Supraganglionic infarction (M4-M6 cortex): 3

Total score (0-10 with 10 being normal): 10

These results were communicated to Dr. ALBERG At [DATE] pmon
[DATE]by text page via the [employer] messaging system.
IMPRESSION: No evidence of acute intracranial hemorrhage or acute infarct.
ASPECTS is 10.

Asymmetric extra-axial CSF density overlying the left cerebral
hemisphere measuring up to 6 mm in thickness. This may be related to
patient head positioning at the time of the examination. A chronic
subdural hematoma or subdural hygroma cannot be excluded. 4 mm
rightward midline shift. Again, this may be related to patient head
positioning or mass effect from a left subdural collection.

Mild cerebral atrophy and chronic small vessel ischemic disease.

Paranasal sinus disease, most notably chronic right maxillary
sinusitis.

## 2020-11-14 IMAGING — DX DG CHEST 1V PORT
1 series · 1 of 1 positions shown · non-contrast
Comparison: None.

CLINICAL DATA: Pt states he woke up at [DATE] and his arms felt
shaky. He went back to bed and woke up at [EY]. He felt unbalanced
and couldn't get out of bed and kept falling to his R sided. He was
having a hard time with the coordination of his R arm and his L eye
was blurry. Per [REDACTED], they stated pt's hr went from 40 - 110.

EXAM:
PORTABLE CHEST 1 VIEW

[chest ap]
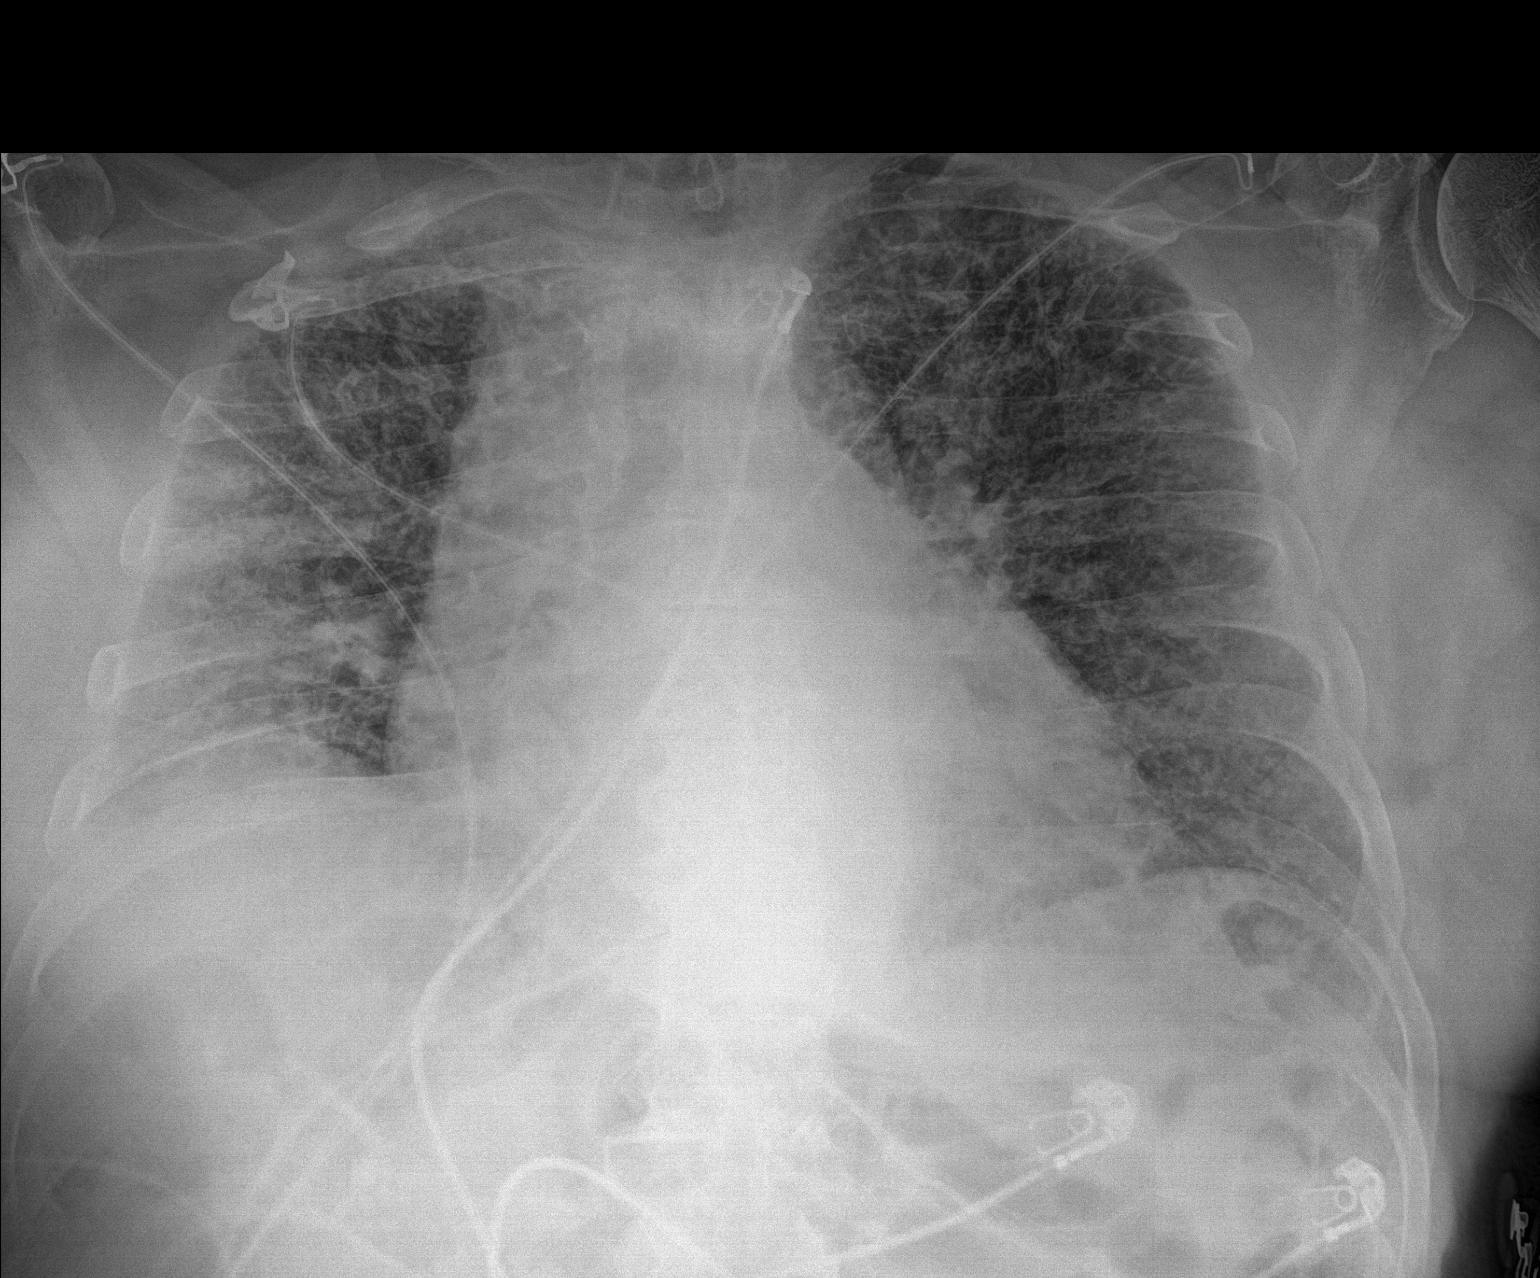

[1 of 1 positions shown; findings below may reference images not displayed]

FINDINGS: Cardiac silhouette is normal in size. No mediastinal or hilar
masses.

There are bilateral coarsely thickened interstitial markings. Subtle
hazy opacity also noted most evident in the right mid to lower lung.

No convincing pleural effusion.  No pneumothorax.

Skeletal structures are grossly intact.
IMPRESSION: 1. Coarsely thickened bilateral interstitial markings with some hazy
type opacity most evident in the right mid to lower lung. These
findings may all be chronic. Consider multifocal infection
particularly on the right, in the proper clinical setting.

## 2020-11-14 MED ORDER — LORAZEPAM 2 MG/ML IJ SOLN
INTRAMUSCULAR | Status: AC
  Administered 2020-11-14: 0.5 mg via INTRAVENOUS
  Filled 2020-11-14: qty 1

## 2020-11-14 MED ORDER — SODIUM CHLORIDE 0.9% FLUSH
3.0000 mL | Freq: Once | INTRAVENOUS | Status: AC
Start: 2020-11-14 — End: 2020-11-14
  Administered 2020-11-14: 3 mL via INTRAVENOUS

## 2020-11-14 MED ORDER — ASPIRIN 325 MG PO TABS
650.0000 mg | ORAL_TABLET | Freq: Every day | ORAL | Status: DC
  Filled 2020-11-14: qty 2

## 2020-11-14 MED ORDER — LORAZEPAM 2 MG/ML IJ SOLN: 0.5000 mg | Freq: Once | INTRAMUSCULAR | Status: AC

## 2020-11-14 MED ORDER — STROKE: EARLY STAGES OF RECOVERY BOOK
Freq: Once | Status: AC
  Filled 2020-11-14: qty 1

## 2020-11-14 MED ORDER — ASPIRIN 325 MG PO TABS
325.0000 mg | ORAL_TABLET | Freq: Every day | ORAL | Status: DC
  Filled 2020-11-14: qty 1

## 2020-11-14 MED ORDER — ASPIRIN 300 MG RE SUPP
600.0000 mg | Freq: Once | RECTAL | Status: AC
  Administered 2020-11-14: 600 mg via RECTAL
  Filled 2020-11-14: qty 2

## 2020-11-14 MED ORDER — ACETAMINOPHEN 325 MG PO TABS: 650.0000 mg | ORAL_TABLET | Freq: Four times a day (QID) | ORAL | Status: DC | PRN

## 2020-11-14 MED ORDER — ACETAMINOPHEN 650 MG RE SUPP: 650.0000 mg | Freq: Four times a day (QID) | RECTAL | Status: DC | PRN

## 2020-11-14 NOTE — ED Notes (Signed)
MD notified of elevated trop

## 2020-11-14 NOTE — Consult Note (Signed)
Cardiology Consultation:   Patient ID: Martin Collins MRN: [id number]; DOB: 03-04-1934  Admit date: 11/14/2020 Date of Consult: 11/14/2020  Primary Care Provider: Patient, No Pcp Per [facility] Cardiologist: No primary care provider on file.  [facility] Electrophysiologist:  None   Patient Profile:    Martin Collins is an 55M with no known medical hx who presents with new onset left ocular visual range however bilateral upper extremity tremor, falling in the right side coordination issues with his last known normal around 10:30 AM.  Cardiology is consulted for elevated troponins.  History of Present Illness:   Martin Collins has difficulty relating his symptoms post acute CVA although he does respond in short sentences and can comprehend all questions asked.  He was responding to me intermittently in third person and seemed to have word finding difficulty.  All of his symptoms are likely related to small acute infarcts found within his left parietal cortex and left frontoparietal white matter on MRI. Patient denies any chest pain, chest pressure, shortness of breath, or dyspnea on exertion.  Past Medical History:  Diagnosis Date  . Acid reflux    History reviewed. No pertinent surgical history.   Home Medications:  Prior to Admission medications   Not on File    Inpatient Medications: Scheduled Meds: .  stroke: mapping our early stages of recovery book   Does not apply Once   Continuous Infusions:  PRN Meds: acetaminophen **OR** acetaminophen  Allergies:   No Known Allergies  Social History:   Social History   Socioeconomic History  . Marital status: Divorced    Spouse name: Not on file  . Number of children: Not on file  . Years of education: Not on file  . Highest education level: Not on file  Occupational History  . Not on file  Tobacco Use  . Smoking status: Former Smoker    Packs/day: 1.00    Years: 30.00    Pack years: 30.00    Quit date: 2010    Years since  quitting: 12.1  . Smokeless tobacco: Never Used  Substance and Sexual Activity  . Alcohol use: Yes    Alcohol/week: 7.0 standard drinks    Types: 7 Glasses of wine per week  . Drug use: Not Currently  . Sexual activity: Not on file  Other Topics Concern  . Not on file  Social History Narrative  . Not on file   Social Determinants of Health   Financial Resource Strain: Not on file  Food Insecurity: Not on file  Transportation Needs: Not on file  Physical Activity: Not on file  Stress: Not on file  Social Connections: Not on file  Intimate Partner Violence: Not on file    Family History:    *History reviewed. No pertinent family history.   ROS:  Review of Systems: [y] = yes, [ ]  = no       General: Weight gain [ ] ; Weight loss [ ] ; Anorexia [ ] ; Fatigue [ ] ; Fever [ ] ; Chills [ ] ; Weakness [ ]     Cardiac: Chest pain/pressure [ ] ; Resting SOB [ ] ; Exertional SOB [ ] ; Orthopnea [ ] ; Pedal Edema [ ] ; Palpitations [ ] ; Syncope [ ] ; Presyncope [ ] ; Paroxysmal nocturnal dyspnea [ ]     Pulmonary: Cough [ ] ; Wheezing [ ] ; Hemoptysis [ ] ; Sputum [ ] ; Snoring [ ]     GI: Vomiting [ ] ; Dysphagia [ ] ; Melena [ ] ; Hematochezia [ ] ; Heartburn [ ] ; Abdominal pain [ ] ;  Constipation [ ] ; Diarrhea [ ] ; BRBPR [ ]     GU: Hematuria [ ] ; Dysuria [ ] ; Nocturia [ ]   Vascular: Pain in legs with walking [ ] ; Pain in feet with lying flat [ ] ; Non-healing sores [ ] ; Stroke [ ] ; TIA [ ] ; Slurred speech [ ] ;    Neuro: Headaches [ ] ; Vertigo [ ] ; Seizures [ ] ; Paresthesias [ ] ;Blurred vision [y]; Diplopia [ ] ; Vision changes [y]    Ortho/Skin: Arthritis [ ] ; Joint pain [ ] ; Muscle pain [ ] ; Joint swelling [ ] ; Back Pain [ ] ; Rash [ ]     Psych: Depression [ ] ; Anxiety [ ]     Heme: Bleeding problems [ ] ; Clotting disorders [ ] ; Anemia [ ]     Endocrine: Diabetes [ ] ; Thyroid dysfunction [ ]    Physical Exam/Data:   Vitals:   11/14/20 1845 11/14/20 1900 11/14/20 1915 11/14/20 2000  BP: 138/63 (!)  155/97 (!) 161/98 (!) 145/64  Pulse: 84 (!) 55 68 (!) 52  Resp: 13 20 16 13   Temp:      TempSrc:      SpO2: 99% 98% 98% 100%  Weight:      Height:       No intake or output data in the 24 hours ending 11/14/20 2035 Last 3 Weights 11/14/2020 11/14/2020  Weight (lbs) 251 lb 8.7 oz 251 lb 8.7 oz  Weight (kg) 114.1 kg 114.1 kg     Body mass index is 31.44 kg/m.  General:  Well nourished, well developed, in no acute distress HEENT: normal Lymph: no adenopathy Neck: no JVD Endocrine:  No thryomegaly Vascular: No carotid bruits; FA pulses 2+ bilaterally without bruits  Cardiac:  normal S1, S2; RRR; no murmur  Lungs:  clear to auscultation bilaterally, no wheezing, rhonchi or rales  Abd: soft, nontender, no hepatomegaly  Ext: no edema Musculoskeletal:  No deformities, 4/5 strength RUE, otherwise preserved Skin: warm and dry  Neuro: A&O to person and place but difficulty with month/year, word finding difficulty Psych:  Normal affect   EKG:  The EKG was personally reviewed and demonstrates: NSR, frequent PVCs Telemetry:  Telemetry was personally reviewed and demonstrates: NSR  Relevant CV Studies: None  Laboratory Data:  High Sensitivity Troponin:   Recent Labs  Lab 11/14/20 1701 11/14/20 1900  TROPONINIHS 292* 300*     Chemistry Recent Labs  Lab 11/14/20 1610 11/14/20 1614  NA 140 140  K 4.1 4.0  CL 105 107  CO2 23  --   GLUCOSE 123* 112*  BUN 20 23  CREATININE 1.63* 1.50*  CALCIUM 9.3  --   GFRNONAA 41*  --   ANIONGAP 12  --     Recent Labs  Lab 11/14/20 1610  PROT 7.3  ALBUMIN 3.8  AST 31  ALT 14  ALKPHOS 78  BILITOT 1.2   Hematology Recent Labs  Lab 11/14/20 1610 11/14/20 1614  WBC 8.4  --   RBC 4.94  --   HGB 15.2 16.0  HCT 46.8 47.0  MCV 94.7  --   MCH 30.8  --   MCHC 32.5  --   RDW 13.5  --   PLT 191  --    BNP Recent Labs  Lab 11/14/20 1618  BNP 480.2*    DDimer No results for input(s): DDIMER in the last 168  hours.  Radiology/Studies:  DG Chest Portable 1 View  Result Date: 11/14/2020 CLINICAL DATA:  Pt states he woke up at 10:00 and  his arms felt shaky. He went back to bed and woke up at 1400. He felt unbalanced and couldn't get out of bed and kept falling to his R sided. He was having a hard time with the coordination of his R arm and his L eye was blurry. Per [facility], they stated pt's hr went from 40 - 110. EXAM: PORTABLE CHEST 1 VIEW COMPARISON:  None. FINDINGS: Cardiac silhouette is normal in size. No mediastinal or hilar masses. There are bilateral coarsely thickened interstitial markings. Subtle hazy opacity also noted most evident in the right mid to lower lung. No convincing pleural effusion.  No pneumothorax. Skeletal structures are grossly intact. IMPRESSION: 1. Coarsely thickened bilateral interstitial markings with some hazy type opacity most evident in the right mid to lower lung. These findings may all be chronic. Consider multifocal infection particularly on the right, in the proper clinical setting. Electronically Signed   By: Amie Portland M.D.   On: 11/14/2020 17:04   CT HEAD CODE STROKE WO CONTRAST  Result Date: 11/14/2020 CLINICAL DATA:  Code stroke. Neuro deficit, acute, stroke suspected. Additional history provided: Left eye blurriness, right arm sensory and coordination. EXAM: CT HEAD WITHOUT CONTRAST TECHNIQUE: Contiguous axial images were obtained from the base of the skull through the vertex without intravenous contrast. COMPARISON:  No pertinent prior exams available for comparison. FINDINGS: Brain: Mild cerebral atrophy. Asymmetric extra-axial CSF density overlying the left cerebral hemisphere measuring up to 6 mm in thickness. This may be related to patient head positioning at the time of examination. A chronic subdural hematoma or subdural hygroma cannot be excluded. 4 mm rightward midline shift. Again, this may be related to patient head positioning or mass effect from a left  subdural collection. Mild ill-defined hypoattenuation within the cerebral white matter, nonspecific, but compatible with chronic small vessel ischemic disease. There is no acute intracranial hemorrhage. No demarcated cortical infarct. No evidence of intracranial mass. No midline shift. Vascular: No hyperdense vessel.  Atherosclerotic calcifications. Skull: Normal. Negative for fracture or focal lesion. Sinuses/Orbits: Visualized orbits show no acute finding. Moderate mucosal thickening within the right maxillary sinus with associated chronic reactive osteitis. Mild right frontal and bilateral ethmoid sinus mucosal thickening. ASPECTS [facility] Stroke Program Early CT Score) - Ganglionic level infarction (caudate, lentiform nuclei, internal capsule, insula, M1-M3 cortex): 7 - Supraganglionic infarction (M4-M6 cortex): 3 Total score (0-10 with 10 being normal): 10 These results were communicated to Dr. Otelia Limes At 4:31 pmon 2/13/2022by text page via the [facility] messaging system. IMPRESSION: No evidence of acute intracranial hemorrhage or acute infarct. ASPECTS is 10. Asymmetric extra-axial CSF density overlying the left cerebral hemisphere measuring up to 6 mm in thickness. This may be related to patient head positioning at the time of the examination. A chronic subdural hematoma or subdural hygroma cannot be excluded. 4 mm rightward midline shift. Again, this may be related to patient head positioning or mass effect from a left subdural collection. Mild cerebral atrophy and chronic small vessel ischemic disease. Paranasal sinus disease, most notably chronic right maxillary sinusitis. Electronically Signed   By: Jackey Loge DO   On: 11/14/2020 16:33   Assessment and Plan:   Martin Collins is an 78M with no known medical hx who presents with new onset left ocular visual range however bilateral upper extremity tremor, falling in the right side coordination issues with his last known normal around 10:30 AM.  Cardiology is  consulted for elevated troponins.  His troponins were elevated without significant delta.  It  is unclear why is an elevated troponin but likely myocardial injury from demand ischemia given that he is otherwise asymptomatic.  We will plan for TTE with bubble and 30-day outpatient monitor to assess for atrial fibrillation given his acute CVA possible embolic source.  For questions or updates, please contact [facility] Please consult [email or web address] for contact info under   Signed, Linton RumpMatthew A Niam Nepomuceno, MD  11/14/2020 8:35 PM

## 2020-11-14 NOTE — Consult Note (Signed)
Referring Physician: Dr. Lockie Mola    Chief Complaint: Acute onset of left monocular visual blurring, BUE tremor, falling to the right while seated and right sided incoordination.  HPI: Martin Collins is an 87 y.o. male with no known PMHx as he has "not been to a doctor for over 25 years" who presents with acute onset of left monocular visual blurring, BUE tremor, falling to the right while seated and right sided incoordination. He was LKN at 10:30 AM when he went back to sleep after awaking at 1000 with normal function. He woke up at about "1 or 1:30" and at that time experienced severe postural instability, being unable to sit upright in his bed without holding on the the bedpost with his left hand - he would fall to the right if he let go. He called out to his fiance who noted him to be slightly confused, leaning/falling to the right and with severe incoordination of his RUE. He also had diffuse arm tremor. He states that he noticed left monocular visual blurring at that time as well. EMS was called and he was brought to the ED as a Code Stroke.   LSN: 10:30 AM tPA Given: No: Out of the time window  PMHx None per patient, but has not seen a doctor in 25 years  No family history on file.  Social History:  has no history on file for tobacco use, alcohol use, and drug use.  Allergies: Not on File  Medications:  ASA PRN A supplement that he gets online for enlarged prostate symptoms  ROS: Denies headache. Denies other symptoms. Mild confusion and anxiety limit patient's ability to provide a detailed ROS.   Physical Examination: Blood pressure (!) 172/84, pulse 94, temperature 98.1 F (36.7 C), temperature source Oral, resp. rate (!) 22, weight 114.1 kg, SpO2 95 %.  HEENT: [place]/AT Lungs: Respirations unlabored, but when anxious his respiratory rate increases Ext: No edema  Neurologic Examination: Mental Status:  Alert, oriented, thought content appropriate.  Speech fluent without evidence  of aphasia.  Able to follow 3 step commands without difficulty. Cranial Nerves: II:  Decreased visual acuity in left eye. Visual fields are full in both eyes. PERRL.  III,IV, VI: No ptosis. EOMI. No nystagmus. No gaze preference.  V,VII: Smile and grimace are symmetric. Facial temp sensation equal bilaterally VIII: Hearing intact to voice IX,X: Palate rises symmetrically XI: Symmetric shoulder shrug XII: Midline tongue extension  Motor: RUE with 4+/5 strength. Positive orbiting fingers test on the right, but no pronator drift.  LUE 5/5 RLE 4/5 LLE 5/5 Coarse rest tremor intermittently to right arm. Action tremor BUE Mild cogwheeling to BUE.  Sensory: Temp and light touch intact throughout, bilaterally Deep Tendon Reflexes:  2+ bilateral brachioradialis and biceps 4+ bilateral patellae (clonus) 2+ bilateral achilles Toes upgoing bilaterally.  Cerebellar: No ataxia with left FNF. Subtle dysmetria with right FNF.  Gait: Unable to assess  Results for orders placed or performed during the hospital encounter of 11/14/20 (from the past 48 hour(s))  CBG monitoring, ED     Status: Abnormal   Collection Time: 11/14/20  4:08 PM  Result Value Ref Range   Glucose-Capillary 116 (H) 70 - 99 mg/dL    Comment: Glucose reference range applies only to samples taken after fasting for at least 8 hours.  Protime-INR     Status: None   Collection Time: 11/14/20  4:10 PM  Result Value Ref Range   Prothrombin Time 13.8 11.4 - 15.2 seconds  INR 1.1 0.8 - 1.2    Comment: (NOTE) INR goal varies based on device and disease states. Performed at [facility], [address] [place], [place] [address]   APTT     Status: Abnormal   Collection Time: 11/14/20  4:10 PM  Result Value Ref Range   aPTT 37 (H) 24 - 36 seconds    Comment:        IF BASELINE aPTT IS ELEVATED, SUGGEST PATIENT RISK ASSESSMENT BE USED TO DETERMINE APPROPRIATE ANTICOAGULANT THERAPY. Performed at [facility],  [address] [place], [place] [address]   CBC     Status: None   Collection Time: 11/14/20  4:10 PM  Result Value Ref Range   WBC 8.4 4.0 - 10.5 K/uL   RBC 4.94 4.22 - 5.81 MIL/uL   Hemoglobin 15.2 13.0 - 17.0 g/dL   HCT 22.9 79.8 - 92.1 %   MCV 94.7 80.0 - 100.0 fL   MCH 30.8 26.0 - 34.0 pg   MCHC 32.5 30.0 - 36.0 g/dL   RDW 19.4 17.4 - 08.1 %   Platelets 191 150 - 400 K/uL   nRBC 0.0 0.0 - 0.2 %    Comment: Performed at [facility], [address], [place], [place] [address]  Differential     Status: None   Collection Time: 11/14/20  4:10 PM  Result Value Ref Range   Neutrophils Relative % 59 %   Neutro Abs 5.0 1.7 - 7.7 K/uL   Lymphocytes Relative 28 %   Lymphs Abs 2.4 0.7 - 4.0 K/uL   Monocytes Relative 9 %   Monocytes Absolute 0.7 0.1 - 1.0 K/uL   Eosinophils Relative 4 %   Eosinophils Absolute 0.3 0.0 - 0.5 K/uL   Basophils Relative 0 %   Basophils Absolute 0.0 0.0 - 0.1 K/uL   Immature Granulocytes 0 %   Abs Immature Granulocytes 0.02 0.00 - 0.07 K/uL    Comment: Performed at [facility], [address], [place], [place] [address]  I-stat chem 8, ED     Status: Abnormal   Collection Time: 11/14/20  4:14 PM  Result Value Ref Range   Sodium 140 135 - 145 mmol/L   Potassium 4.0 3.5 - 5.1 mmol/L   Chloride 107 98 - 111 mmol/L   BUN 23 8 - 23 mg/dL   Creatinine, Ser 9.70 (H) 0.61 - 1.24 mg/dL   Glucose, Bld 263 (H) 70 - 99 mg/dL    Comment: Glucose reference range applies only to samples taken after fasting for at least 8 hours.   Calcium, Ion 1.08 (L) 1.15 - 1.40 mmol/L   TCO2 21 (L) 22 - 32 mmol/L   Hemoglobin 16.0 13.0 - 17.0 g/dL   HCT 78.5 88.5 - 02.7 %   CT HEAD CODE STROKE WO CONTRAST  Result Date: 11/14/2020 CLINICAL DATA:  Code stroke. Neuro deficit, acute, stroke suspected. Additional history provided: Left eye blurriness, right arm sensory and coordination. EXAM: CT HEAD WITHOUT CONTRAST TECHNIQUE: Contiguous axial images were obtained from  the base of the skull through the vertex without intravenous contrast. COMPARISON:  No pertinent prior exams available for comparison. FINDINGS: Brain: Mild cerebral atrophy. Asymmetric extra-axial CSF density overlying the left cerebral hemisphere measuring up to 6 mm in thickness. This may be related to patient head positioning at the time of examination. A chronic subdural hematoma or subdural hygroma cannot be excluded. 4 mm rightward midline shift. Again, this  may be related to patient head positioning or mass effect from a left subdural collection. Mild ill-defined hypoattenuation within the cerebral white matter, nonspecific, but compatible with chronic small vessel ischemic disease. There is no acute intracranial hemorrhage. No demarcated cortical infarct. No evidence of intracranial mass. No midline shift. Vascular: No hyperdense vessel.  Atherosclerotic calcifications. Skull: Normal. Negative for fracture or focal lesion. Sinuses/Orbits: Visualized orbits show no acute finding. Moderate mucosal thickening within the right maxillary sinus with associated chronic reactive osteitis. Mild right frontal and bilateral ethmoid sinus mucosal thickening. ASPECTS [facility] Stroke Program Early CT Score) - Ganglionic level infarction (caudate, lentiform nuclei, internal capsule, insula, M1-M3 cortex): 7 - Supraganglionic infarction (M4-M6 cortex): 3 Total score (0-10 with 10 being normal): 10 These results were communicated to Dr. Otelia Limes At 4:31 pmon 2/13/2022by text page via the [facility] messaging system. IMPRESSION: No evidence of acute intracranial hemorrhage or acute infarct. ASPECTS is 10. Asymmetric extra-axial CSF density overlying the left cerebral hemisphere measuring up to 6 mm in thickness. This may be related to patient head positioning at the time of the examination. A chronic subdural hematoma or subdural hygroma cannot be excluded. 4 mm rightward midline shift. Again, this may be related to patient head  positioning or mass effect from a left subdural collection. Mild cerebral atrophy and chronic small vessel ischemic disease. Paranasal sinus disease, most notably chronic right maxillary sinusitis. Electronically Signed   By: Jackey Loge DO   On: 11/14/2020 16:33    Assessment: 87 y.o. male presenting with acute onset of left monocular visual blurring, BUE tremor, falling to the right while seated and right sided incoordination. 1. Exam reveals findings referable to the left cerebral hemisphere, best localizable as a left thalamic infarction. 2. CT head without acute abnormality. The subdural CSF spaces over the cerebral convexities is asymmetric, that overlying the left cerebral hemisphere measuring up to 6 mm in thickness. This may be related to patient head positioning at the time of the examination. A chronic subdural hematoma or subdural hygroma cannot be excluded. 4 mm rightward midline shift. Again, this may be related to patient head positioning or mass effect from a left subdural collection. Mild cerebral atrophy and chronic small vessel ischemic disease is also noted.  3. Not a tPA candidate as he is out of the time window 4. Findings not consistent with an LVO 5. EKG with changes suggestive of left atrial enlargement per Cardiology curbside. An enlarged left atrium increases the likelihood of paroxysmal atrial fibrillation; however, no a-fib is seen on current EKG.  6. Stroke Risk Factors - Advanced age  Recommendations: 1. HgbA1c, fasting lipid panel 2. MRI, MRA of the brain without contrast 3. PT consult, OT consult, Speech consult 4. Echocardiogram 5. Carotid ultrasound 6. Prophylactic therapy- ASA load. Cannot take PO, therefore 600 mg rectal ASA has been ordered.  7. Risk factor modification 8. Telemetry monitoring 9. Frequent neuro checks 10. May need Cardiology consult in the AM given EKG abnormalities that raise the possibility of paroxysmal atrial fibrillation.  11.  Modified permissive HTN protocol for 24 hours given advanced age. Treat SBP if > 180.    @Electronically  signed: Dr.  11/14/2020, 4:41 PM

## 2020-11-14 NOTE — ED Notes (Signed)
Called lab to add on bnp. 

## 2020-11-14 NOTE — ED Triage Notes (Signed)
Pt states he woke up at 10:00 and his arms felt shaky.  He went back to bed and woke up at 1400.  He felt unbalanced and couldn't get out of bed and kept falling to his R sided.  He was having a hard time with the coordination of his R arm and his L eye was blurry.  Per [facility], they stated pt's hr went from 40 - 110.  VS:   Bp: 187/70 Hr: 108 O2: 96% Rr: 20 Cbg: 112

## 2020-11-14 NOTE — ED Notes (Signed)
Patient transported to MRI 

## 2020-11-14 NOTE — ED Notes (Signed)
Patient currently at MRI

## 2020-11-14 NOTE — Plan of Care (Signed)
On-call cross coverage note  Notified by neuro rads-Dr. Renette Butters of the findings on MRA head and neck without contrast: Salient features of the report: -Right ICA poorly delineated shortly below origin-likely occluded or near occluded. -Left CCA nonvisualized except for very proximal portion. 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 -Dominant left vertebral artery is patent within the neck with apparent moderate stenosis of the origin of this vessel.  Flow-related signal is seen intermittently within the nondominant cervical right vert. -Intracranial internal carotid arteries remain occluded or near occluded through the siphon region with reconstitution of the intracranial ICAs at the supraclinoid level. -Degenerated appearance of multiple proximal M2 left MCA branches however no left M2 proximal branch occlusion is identified. -Moderate stenosis of the A1 right ICA  This is a very concerning picture and I wanted a better picture of his head and neck vasculature for which I ordered a CTA head and neck, which was read by different neuroradiologist with nearly the similar findings of occlusion of the left common carotid immediately beyond the origin with reconstituted flow in the left external carotid artery and no flow in the left cervical internal carotid artery. Occlusion of the right internal carotid artery at the bifurcation with severe stenosis of the right external carotid artery. Occlusion of the right vertebral artery at origin but reconstitutes as a small vessel via cervical collaterals. No intracranial large or medium vessel occlusion with no correctable stenosis  Mediastinal adenopathy including necrotic appearing nodule in the lower right paratracheal region measuring 2.5 cm.  Complete chest evaluation suggested to rule out malignancy.  Emphysema and scarring.  Fluid in major fissure on the right or some consolidation of the dorsal aspect of  anterior right upper lobe.  Emphysema and aortic atherosclerosis.  Recommendations: As in the consult by Dr. Otelia Limes.  Consider dual antiplatelets. I would agree with the permissive hypertension  Stroke team will follow  -- Milon Dikes, MD Neurologist [facility] Pager: [phone]

## 2020-11-14 NOTE — ED Notes (Signed)
Pt placed on [place] for sats of 90%.  Placed on 2L .  MD aware.

## 2020-11-14 NOTE — ED Provider Notes (Signed)
[facility] EMERGENCY DEPARTMENT Provider Note   CSN: [id number] Arrival date & time: 11/14/20  1604  An emergency department physician performed an initial assessment on this suspected stroke patient at 1609.  History Chief Complaint  Patient presents with  . Code Stroke    Martin Collins is a 87 y.o. male.  Last known normal around 2 PM.  Patient with some left visual field issues as well as may be some right-sided weakness/balance problems.  Patient upon my evaluation denies any weakness or numbness.  May be some difficulty seeing out of his left eye.  No history of stroke.  Denies any shortness of breath or chest pain.  No history of atrial fibrillation.  The history is provided by the patient and a caregiver.  Neurologic Problem This is a new problem. The current episode started 3 to 5 hours ago. The problem occurs constantly. The problem has not changed since onset.Pertinent negatives include no chest pain, no abdominal pain, no headaches and no shortness of breath. Nothing aggravates the symptoms. Nothing relieves the symptoms. He has tried nothing for the symptoms. The treatment provided no relief.       Past Medical History:  Diagnosis Date  . Acid reflux     Patient Active Problem List   Diagnosis Date Noted  . Stroke [facility]) 11/14/2020    History reviewed. No pertinent surgical history.     History reviewed. No pertinent family history.  Social History   Tobacco Use  . Smoking status: Former Smoker    Quit date: 2010    Years since quitting: 12.1  . Smokeless tobacco: Never Used  Substance Use Topics  . Alcohol use: Yes    Alcohol/week: 7.0 standard drinks    Types: 7 Glasses of wine per week  . Drug use: Not Currently    Home Medications Prior to Admission medications   Not on File    Allergies    Patient has no known allergies.  Review of Systems   Review of Systems  Constitutional: Negative for chills and fever.  HENT:  Negative for ear pain and sore throat.   Eyes: Positive for visual disturbance. Negative for pain.  Respiratory: Negative for cough and shortness of breath.   Cardiovascular: Negative for chest pain and palpitations.  Gastrointestinal: Negative for abdominal pain and vomiting.  Genitourinary: Negative for dysuria and hematuria.  Musculoskeletal: Negative for arthralgias and back pain.  Skin: Negative for color change and rash.  Neurological: Positive for dizziness, tremors and weakness. Negative for seizures, syncope, facial asymmetry, speech difficulty, light-headedness, numbness and headaches.  All other systems reviewed and are negative.   Physical Exam Updated Vital Signs  ED Triage Vitals  Enc Vitals Group     BP 11/14/20 1610 (!) 172/84     Pulse Rate 11/14/20 1610 94     Resp 11/14/20 1610 (!) 22     Temp 11/14/20 1610 98.1 F (36.7 C)     Temp Source 11/14/20 1610 Oral     SpO2 11/14/20 1610 95 %     Weight 11/14/20 1600 251 lb 8.7 oz (114.1 kg)     Height 11/14/20 1641 6\' 3"  (1.905 m)     Head Circumference --      Peak Flow --      Pain Score 11/14/20 1640 0     Pain Loc --      Pain Edu? --      Excl. in GC? --  Physical Exam Vitals and nursing note reviewed.  Constitutional:      General: He is not in acute distress.    Appearance: He is well-developed and well-nourished. He is not ill-appearing.  HENT:     Head: Normocephalic and atraumatic.     Nose: Nose normal.     Mouth/Throat:     Mouth: Mucous membranes are moist.  Eyes:     Extraocular Movements: Extraocular movements intact.     Conjunctiva/sclera: Conjunctivae normal.     Pupils: Pupils are equal, round, and reactive to light.  Cardiovascular:     Rate and Rhythm: Normal rate and regular rhythm.     Pulses: Normal pulses.     Heart sounds: Normal heart sounds. No murmur heard.   Pulmonary:     Effort: Pulmonary effort is normal. No respiratory distress.     Breath sounds: Normal breath  sounds.  Abdominal:     Palpations: Abdomen is soft.     Tenderness: There is no abdominal tenderness.  Musculoskeletal:        General: No edema. Normal range of motion.     Cervical back: Normal range of motion and neck supple.  Skin:    General: Skin is warm and dry.     Capillary Refill: Capillary refill takes less than 2 seconds.  Neurological:     Mental Status: He is alert.     Sensory: No sensory deficit.     Comments: Overall maybe some trace weakness to the right side but overall sort of change in neurological exam on repeat exams, left side appears to be 5 out of 5 strength, sensation appears to be intact, no obvious visual field deficit but does admit to some blurred vision in the left eye does not wear glasses, normal speech, has been having intermittent tremors, gait not tested, does have some ataxia with right-sided finger-to-nose finger  Psychiatric:        Mood and Affect: Mood and affect and mood normal.     ED Results / Procedures / Treatments   Labs (all labs ordered are listed, but only abnormal results are displayed) Labs Reviewed  APTT - Abnormal; Notable for the following components:      Result Value   aPTT 37 (*)    All other components within normal limits  COMPREHENSIVE METABOLIC PANEL - Abnormal; Notable for the following components:   Glucose, Bld 123 (*)    Creatinine, Ser 1.63 (*)    GFR, Estimated 41 (*)    All other components within normal limits  BRAIN NATRIURETIC PEPTIDE - Abnormal; Notable for the following components:   B Natriuretic Peptide 480.2 (*)    All other components within normal limits  I-STAT CHEM 8, ED - Abnormal; Notable for the following components:   Creatinine, Ser 1.50 (*)    Glucose, Bld 112 (*)    Calcium, Ion 1.08 (*)    TCO2 21 (*)    All other components within normal limits  CBG MONITORING, ED - Abnormal; Notable for the following components:   Glucose-Capillary 116 (*)    All other components within normal  limits  TROPONIN I (HIGH SENSITIVITY) - Abnormal; Notable for the following components:   Troponin I (High Sensitivity) 292 (*)    All other components within normal limits  TROPONIN I (HIGH SENSITIVITY) - Abnormal; Notable for the following components:   Troponin I (High Sensitivity) 300 (*)    All other components within normal limits  SARS CORONAVIRUS 2 (  TAT 6-24 HRS)  PROTIME-INR  CBC  DIFFERENTIAL  AMMONIA  URINALYSIS, ROUTINE W REFLEX MICROSCOPIC    EKG EKG Interpretation  Date/Time:  Sunday November 14 2020 16:33:08 EST Ventricular Rate:  61 PR Interval:    QRS Duration: 114 QT Interval:  416 QTC Calculation: 419 R Axis:   -17 Text Interpretation: Sinus rhythm Atrial premature complex LVH with secondary repolarization abnormality Confirmed by Virgina NorfolkAdam, Taniela Feltus [phone]([id number]) on 11/14/2020 4:50:59 PM   Radiology DG Chest Portable 1 View  Result Date: 11/14/2020 CLINICAL DATA:  Pt states he woke up at 10:00 and his arms felt shaky. He went back to bed and woke up at 1400. He felt unbalanced and couldn't get out of bed and kept falling to his R sided. He was having a hard time with the coordination of his R arm and his L eye was blurry. Per [facility], they stated pt's hr went from 40 - 110. EXAM: PORTABLE CHEST 1 VIEW COMPARISON:  None. FINDINGS: Cardiac silhouette is normal in size. No mediastinal or hilar masses. There are bilateral coarsely thickened interstitial markings. Subtle hazy opacity also noted most evident in the right mid to lower lung. No convincing pleural effusion.  No pneumothorax. Skeletal structures are grossly intact. IMPRESSION: 1. Coarsely thickened bilateral interstitial markings with some hazy type opacity most evident in the right mid to lower lung. These findings may all be chronic. Consider multifocal infection particularly on the right, in the proper clinical setting. Electronically Signed   By: Amie Portlandavid  Ormond M.D.   On: 11/14/2020 17:04   CT HEAD CODE STROKE WO  CONTRAST  Result Date: 11/14/2020 CLINICAL DATA:  Code stroke. Neuro deficit, acute, stroke suspected. Additional history provided: Left eye blurriness, right arm sensory and coordination. EXAM: CT HEAD WITHOUT CONTRAST TECHNIQUE: Contiguous axial images were obtained from the base of the skull through the vertex without intravenous contrast. COMPARISON:  No pertinent prior exams available for comparison. FINDINGS: Brain: Mild cerebral atrophy. Asymmetric extra-axial CSF density overlying the left cerebral hemisphere measuring up to 6 mm in thickness. This may be related to patient head positioning at the time of examination. A chronic subdural hematoma or subdural hygroma cannot be excluded. 4 mm rightward midline shift. Again, this may be related to patient head positioning or mass effect from a left subdural collection. Mild ill-defined hypoattenuation within the cerebral white matter, nonspecific, but compatible with chronic small vessel ischemic disease. There is no acute intracranial hemorrhage. No demarcated cortical infarct. No evidence of intracranial mass. No midline shift. Vascular: No hyperdense vessel.  Atherosclerotic calcifications. Skull: Normal. Negative for fracture or focal lesion. Sinuses/Orbits: Visualized orbits show no acute finding. Moderate mucosal thickening within the right maxillary sinus with associated chronic reactive osteitis. Mild right frontal and bilateral ethmoid sinus mucosal thickening. ASPECTS [facility]([place] Stroke Program Early CT Score) - Ganglionic level infarction (caudate, lentiform nuclei, internal capsule, insula, M1-M3 cortex): 7 - Supraganglionic infarction (M4-M6 cortex): 3 Total score (0-10 with 10 being normal): 10 These results were communicated to Dr. Otelia LimesLindzen At 4:31 pmon 2/13/2022by text page via the [employer] messaging system. IMPRESSION: No evidence of acute intracranial hemorrhage or acute infarct. ASPECTS is 10. Asymmetric extra-axial CSF density overlying the left  cerebral hemisphere measuring up to 6 mm in thickness. This may be related to patient head positioning at the time of the examination. A chronic subdural hematoma or subdural hygroma cannot be excluded. 4 mm rightward midline shift. Again, this may be related to patient head positioning or mass effect  from a left subdural collection. Mild cerebral atrophy and chronic small vessel ischemic disease. Paranasal sinus disease, most notably chronic right maxillary sinusitis. Electronically Signed   By: Jackey Loge DO   On: 11/14/2020 16:33    Procedures .Critical Care Performed by: Virgina Norfolk, DO Authorized by: Virgina Norfolk, DO   Critical care provider statement:    Critical care time (minutes):  35   Critical care was necessary to treat or prevent imminent or life-threatening deterioration of the following conditions:  CNS failure or compromise   Critical care was time spent personally by me on the following activities:  Blood draw for specimens, development of treatment plan with patient or surrogate, discussions with consultants, discussions with primary provider, evaluation of patient's response to treatment, examination of patient, obtaining history from patient or surrogate, ordering and performing treatments and interventions, ordering and review of laboratory studies, ordering and review of radiographic studies, pulse oximetry, re-evaluation of patient's condition and review of old charts   I assumed direction of critical care for this patient from another provider in my specialty: no       Medications Ordered in ED Medications  sodium chloride flush (NS) 0.9 % injection 3 mL (3 mLs Intravenous Given 11/14/20 1700)  LORazepam (ATIVAN) injection 0.5 mg (0.5 mg Intravenous Given 11/14/20 1710)  aspirin suppository 600 mg (600 mg Rectal Given 11/14/20 1815)    ED Course  I have reviewed the triage vital signs and the nursing notes.  Pertinent labs & imaging results that were available  during my care of the patient were reviewed by me and considered in my medical decision making (see chart for details).    MDM Rules/Calculators/A&P                          Daegan Arizmendi is an 87 year old male with no significant medical history presents the ED as a code stroke.  Overall story was difficult to obtain but it appears that last known normal was around 130.  Patient within the TPA window.  However has very minimal symptoms.  Exams seem to change fairly often when evaluating the patient.  In the end he may have some right-sided trace weakness.  He might have some left-sided blurry vision.  He states that he noticed blurry vision and imbalance and tremors around 130.  CT imaging showed no head bleed.  However given minimal symptoms and difficult getting a true exam TPA was held.  Will admit for stroke work-up with MRI.  Patient did appear to be having some labored breathing but denied any chest pain or shortness of breath.  EKG shows sinus rhythm.  No obvious atrial fibrillation but did quickly talk with cardiology to review EKG and they agreed that patient did not have A. fib but did have biphasic P waves that sometimes can be seen with paroxysmal A. fib.  I ended up getting a troponin and BNP which were both elevated.  Troponin was 292.  BNP was 480.  Otherwise lab work was unremarkable.  Chest x-ray with some thickened bilateral interstitial markings.  This could be fluid but also may be atypical infection.  Fully vaccinated boosted against COVID.  Will check Covid.  No fever.  No cough.  Lower concern for infectious process.  Awaiting to hear back from cardiology about further recommendations but suspect that patient will need echocardiogram and repeat troponins.  Suspect that this is likely from demand from stroke/volume overload.  Suspect  that patient likely has paroxysmal A. fib.  Cardiology recommends echocardiogram.  They will evaluate the patient in the ED and leave any further recs.   Troponin stable at 300.  This chart was dictated using voice recognition software.  Despite best efforts to proofread,  errors can occur which can change the documentation meaning.    Final Clinical Impression(s) / ED Diagnoses Final diagnoses:  Stroke-like symptoms  Elevated troponin    Rx / DC Orders ED Discharge Orders    None       Virgina Norfolk, DO 11/14/20 2016

## 2020-11-14 NOTE — H&P (Signed)
History and Physical    PLEASE NOTE THAT DRAGON DICTATION SOFTWARE WAS USED IN THE CONSTRUCTION OF THIS NOTE.   Martin Collins ION:[id number] DOB: 06/04/1934 DOA: 11/14/2020  PCP: Patient, No Pcp Per Patient coming from: home   I have personally briefly reviewed patient's old medical records in [facility]  Chief Complaint: Blurry vision in left eye  HPI: Martin Collins is a 87 y.o. male with medical history significant for GERD, who is [facility] on 11/14/2020 with suspected acute ischemic stroke after presenting from home [facility] ED complaining of blurry vision in the left eye.   The patient reports that he fell asleep for a nap in his usual state of health, without any acute focal neurologic deficits, at approximately 1030 on the morning of 11/14/2020.  Socially, he reports that he awoke from his nap between 1300-1400 with new onset blurry vision in the left eye, mild tremors in the bilateral upper extremities, and potential weakness in the right upper and lower extremity.  The patient reports that, as a result of the new onset right-sided weakness, that he was experiencing a sensation of instability with ambulation as well as concerned that he would fall to the right side if not properly supported.  Denies any prior history of unilateral weakness or tremors.  He reports no significant improvement in the above symptoms following onset.  Denies any associated acute change in sensation, including no acute paresthesias or numbness.  He also denies any associated acute dysphagia, dizziness, vertigo, nausea, vomiting, diplopia, word finding difficulties, or headache.  Denies any associated facial droop or slurring of speech.  Denies any associated chest pain, shortness of breath, palpitations, diaphoresis, presyncope, syncope, orthopnea, PND, or peripheral edema.  In terms of medical history, the patient acknowledges that he has not seen a physician in approximately 30 years.   However, within this framework, he denies any known history of prior stroke.  In terms of modifiable risk factors, the patient is not aware of any underlying history of hypertension, hyperlipidemia, diabetes, atrial fibrillation, obstructive sleep apnea.  He does however report that he is a former smoker, having completely quit smoking in 2010 after smoking approximately a pack per day for about 30 years.  Not on any antiplatelet or anticoagulation medications at home.  He also reports that he is not on any lipid-lowering medications as an outpatient.  She denies any recent subjective fever, chills, rigors, or generalized myalgias.  Denies any recent neck stiffness, rhinitis, rhinorrhea, sore throat, wheezing, cough, abdominal pain, diarrhea, or rash.  No recent traveling or known COVID-19 exposures.  Denies any recent dysuria, gross hematuria, or change in urinary urgency/frequency.  In the setting of th patient's reported acute onset of blurry vision in the left eye, EMS was contacted by the patient's fianc, and he was subsequently brought to [facility] emergency department for further evaluation and management of the above.  Of note, the patient [facility] in [facility] urgency department at 1604 on 11/14/2020.    ED Course:  Vital signs in the ED were notable for the following: Temperature max 98.1; heart rate 55-84; blood pressure 145/64 - 189/79; respiratory rate 16-22; oxygen saturation 94 to 98% on room air.  Labs were notable for the following: Point-of-care glucose 116; CMP was notable for the following: Sodium 140, potassium 4.2, bicarbonate 23, BUN 20, creatinine 1.63 with no prior serum creatinine data points available for point of comparison.  CBC was notable for the following: White  blood cell count of 8400, hemoglobin 15.2.  High-sensitivity troponin initially found to be 292, with repeat value increasing slightly to 300, with no prior troponin values available for point of comparison.   Urinalysis was ordered, with result currently pending.  Screening COVID-19 PCR was performed in the emergency department this evening, with result currently pending.  EKG showed sinus rhythm with PVCs, ventricular rate 82, and no evidence of T wave or ST changes, including no evidence of ST elevation.  Chest x-ray showed coarsely thickened bilateral interstitial markings, most evident in the right mid to lower lung, potentially representing chronic findings, without evidence of pleural effusion or pneumothorax.  Noncontrast CT of the head showed no evidence of acute intracranial hemorrhage or acute infarct, while showing evidence of mild cerebral atrophy and chronic small vessel ischemic disease.  The patient's case was discussed with the on-call neurologist, stressors are, who is evaluated the patient in the ED this evening.  Dr. Otelia Limes suspects potential acute ischemic stroke, and recommends admission to the hospitalist service for further evaluation management of such, including pursuit of MRI, MRA of the brain in addition to echocardiogram with bubble study, and additional evaluation for modifiable acute ischemic CVA risk factors, as further detailed below.  Dr.Lindzen Also recommends that the patient receive aspirin loading dose this evening, and in the context of the patient currently unable to take p.o., recommends aspirin 600 mg PR at this time.  Dr.Lindzen in the context of potential biphasic P waves, recommends consideration for cardiology consultation in the morning to further evaluate for the possibility of underlying paroxysmal atrial fibrillation.  In the setting of telemetry suggesting biphasic P waves, cardiology was consulted, with Dr. Ronni Rumble to evaluate the patient.  Initial cardiology recommendations include echocardiogram and trending of troponin.  While in the ED, the following were administered: Aspirin suppository 600 mg PR x1.     Review of Systems: As per HPI  otherwise 10 point review of systems negative.   Past Medical History:  Diagnosis Date  . Acid reflux     History reviewed. No pertinent surgical history.  Social History:  reports that he quit smoking about 12 years ago. He has a 30.00 pack-year smoking history. He has never used smokeless tobacco. He reports current alcohol use of about 7.0 standard drinks of alcohol per week. He reports previous drug use.   No Known Allergies  History reviewed. No pertinent family history.   Prior to Admission medications   Not on File  (The patient denies taking any prescribed or over-the-counter medications at home)   Objective    Physical Exam: Vitals:   11/14/20 1845 11/14/20 1900 11/14/20 1915 11/14/20 2000  BP: 138/63 (!) 155/97 (!) 161/98 (!) 145/64  Pulse: 84 (!) 55 68 (!) 52  Resp: Temp:      TempSrc:      SpO2: 99% 98% 98% 100%  Weight:      Height:        General: appears to be stated age; alert, oriented Skin: warm, dry, no rash Head:  AT/[place] Mouth:  Oral mucosa membranes appear moist, normal dentition Neck: supple; trachea midline Heart:  RRR; did not appreciate any M/R/G Lungs: CTAB, did not appreciate any wheezes, rales, or rhonchi Abdomen: + BS; soft, ND, NT Vascular: 2+ pedal pulses b/l; 2+ radial pulses b/l Extremities: no peripheral edema, no muscle wasting Neuro: 5/5 strength of the proximal and distal flexors and extensors of the upper and lower  extremities on left; 4/5 strength of the proximal and distal flexors and extensors of the upper and lower extremities on right; sensation intact in upper and lower extremities b/l; cranial nerves II through XII grossly intact; no evidence suggestive of slurred speech, dysarthria, or facial droop; normal heel-to-shin bilaterally; mild dysmetria noted on normal finger to nose on right;    Labs on Admission: I have personally reviewed following labs and imaging studies  CBC: Recent Labs  Lab  11/14/20 1610 11/14/20 1614  WBC 8.4  --   NEUTROABS 5.0  --   HGB 15.2 16.0  HCT 46.8 47.0  MCV 94.7  --   PLT 191  --    Basic Metabolic Panel: Recent Labs  Lab 11/14/20 1610 11/14/20 1614  NA 140 140  K 4.1 4.0  CL 105 107  CO2 23  --   GLUCOSE 123* 112*  BUN 20 23  CREATININE 1.63* 1.50*  CALCIUM 9.3  --    GFR: Estimated Creatinine Clearance: 49.9 mL/min (A) (by C-G formula based on SCr of 1.5 mg/dL (H)). Liver Function Tests: Recent Labs  Lab 11/14/20 1610  AST 31  ALT 14  ALKPHOS 78  BILITOT 1.2  PROT 7.3  ALBUMIN 3.8   No results for input(s): LIPASE, AMYLASE in the last 168 hours. Recent Labs  Lab 11/14/20 1701  AMMONIA 26   Coagulation Profile: Recent Labs  Lab 11/14/20 1610  INR 1.1   Cardiac Enzymes: No results for input(s): CKTOTAL, CKMB, CKMBINDEX, TROPONINI in the last 168 hours. BNP (last 3 results) No results for input(s): PROBNP in the last 8760 hours. HbA1C: No results for input(s): HGBA1C in the last 72 hours. CBG: Recent Labs  Lab 11/14/20 1608  GLUCAP 116*   Lipid Profile: No results for input(s): CHOL, HDL, LDLCALC, TRIG, CHOLHDL, LDLDIRECT in the last 72 hours. Thyroid Function Tests: No results for input(s): TSH, T4TOTAL, FREET4, T3FREE, THYROIDAB in the last 72 hours. Anemia Panel: No results for input(s): VITAMINB12, FOLATE, FERRITIN, TIBC, IRON, RETICCTPCT in the last 72 hours. Urine analysis: No results found for: COLORURINE, APPEARANCEUR, LABSPEC, PHURINE, GLUCOSEU, HGBUR, BILIRUBINUR, KETONESUR, PROTEINUR, UROBILINOGEN, NITRITE, LEUKOCYTESUR  Radiological Exams on Admission: DG Chest Portable 1 View  Result Date: 11/14/2020 CLINICAL DATA:  Pt states he woke up at 10:00 and his arms felt shaky. He went back to bed and woke up at 1400. He felt unbalanced and couldn't get out of bed and kept falling to his R sided. He was having a hard time with the coordination of his R arm and his L eye was blurry. Per [facility], they  stated pt's hr went from 40 - 110. EXAM: PORTABLE CHEST 1 VIEW COMPARISON:  None. FINDINGS: Cardiac silhouette is normal in size. No mediastinal or hilar masses. There are bilateral coarsely thickened interstitial markings. Subtle hazy opacity also noted most evident in the right mid to lower lung. No convincing pleural effusion.  No pneumothorax. Skeletal structures are grossly intact. IMPRESSION: 1. Coarsely thickened bilateral interstitial markings with some hazy type opacity most evident in the right mid to lower lung. These findings may all be chronic. Consider multifocal infection particularly on the right, in the proper clinical setting. Electronically Signed   By: Amie Portland M.D.   On: 11/14/2020 17:04   CT HEAD CODE STROKE WO CONTRAST  Result Date: 11/14/2020 CLINICAL DATA:  Code stroke. Neuro deficit, acute, stroke suspected. Additional history provided: Left eye blurriness, right arm sensory and coordination. EXAM: CT HEAD WITHOUT CONTRAST  TECHNIQUE: Contiguous axial images were obtained from the base of the skull through the vertex without intravenous contrast. COMPARISON:  No pertinent prior exams available for comparison. FINDINGS: Brain: Mild cerebral atrophy. Asymmetric extra-axial CSF density overlying the left cerebral hemisphere measuring up to 6 mm in thickness. This may be related to patient head positioning at the time of examination. A chronic subdural hematoma or subdural hygroma cannot be excluded. 4 mm rightward midline shift. Again, this may be related to patient head positioning or mass effect from a left subdural collection. Mild ill-defined hypoattenuation within the cerebral white matter, nonspecific, but compatible with chronic small vessel ischemic disease. There is no acute intracranial hemorrhage. No demarcated cortical infarct. No evidence of intracranial mass. No midline shift. Vascular: No hyperdense vessel.  Atherosclerotic calcifications. Skull: Normal. Negative for  fracture or focal lesion. Sinuses/Orbits: Visualized orbits show no acute finding. Moderate mucosal thickening within the right maxillary sinus with associated chronic reactive osteitis. Mild right frontal and bilateral ethmoid sinus mucosal thickening. ASPECTS [facility] Stroke Program Early CT Score) - Ganglionic level infarction (caudate, lentiform nuclei, internal capsule, insula, M1-M3 cortex): 7 - Supraganglionic infarction (M4-M6 cortex): 3 Total score (0-10 with 10 being normal): 10 These results were communicated to Dr. Otelia Limes At 4:31 pmon 2/13/2022by text page via the [facility] messaging system. IMPRESSION: No evidence of acute intracranial hemorrhage or acute infarct. ASPECTS is 10. Asymmetric extra-axial CSF density overlying the left cerebral hemisphere measuring up to 6 mm in thickness. This may be related to patient head positioning at the time of the examination. A chronic subdural hematoma or subdural hygroma cannot be excluded. 4 mm rightward midline shift. Again, this may be related to patient head positioning or mass effect from a left subdural collection. Mild cerebral atrophy and chronic small vessel ischemic disease. Paranasal sinus disease, most notably chronic right maxillary sinusitis. Electronically Signed   By: Jackey Loge DO   On: 11/14/2020 16:33     EKG: Independently reviewed, with result as described above.    Assessment/Plan   Lennin Osmond is a 87 y.o. male with medical history significant for GERD, who is admitted to [facility] on 11/14/2020 with suspected acute ischemic stroke after presenting from home to [facility] ED complaining of blurry vision in the left eye.    Principal Problem:   Stroke [facility]) Active Problems:   Elevated troponin   Elevated serum creatinine   Acid reflux    #) Acute ischemic CVA: suspected dx on the basis of acute onset of left monocular visual blurring with potential dysmetria involving the right upper extremity as well as potential right  hemiparesis, with last known normal of 10:30 AM on 11/14/2020, with presenting noncontrast CT of the head showed no evidence of acute intracranial process, including no evidence of acute intracranial hemorrhage or acute infarct.  The on-call neurologist, Dr.Lindzen Has been formally consulted and has evaluated the patient in the ED, following which he conveys his suspicion for potential acute ischemic stroke based upon the above presentation.  Consequently, he recommends further assessment with MRI brain to further evaluate this possibility, and also recommends checking MRA of the head.  Additionally, he recommends further evaluation for potential modifiable acute ischemic CVA risk factors via assessment of hemoglobin A1c, lipid panel, TTE with bubble study to evaluate for intracardiac thrombus, septal wall aneurysm, or septal wall defect, and also to monitor telemetry to evaluate for the presence of previously undiagnosed atrial fibrillation.  Of note, the patient reports that he is  a former smoker, and completely quit smoking in 2010 after 30-pack-year history, but otherwise denies any known history of underlying hypertension, hyperlipidemia, obstructive sleep apnea, diabetes, or paroxysmal atrial fibrillation, but within the confines of the patient's admission that he has not seen a physician in approximately 30 years.  EKG in the ED today showed sinus rhythm with PVCs, although there was some concern for biphasic P waves on telemetry, raising the possibility of potential paroxysmal atrial fibrillation.  Consequently, cardiology has been consulted for further evaluation of potential underlying paroxysmal atrial fibrillation as well as recommendations in the setting of mildly elevated troponin, as further described below.  Additionally, Dr.Lindzen Recommends supportive measures in the form of consultation of PT, OT, and ST.  Dr Otelia Limes  confirms that patient is not a candidate for TPA given that presentation to  the ED today is outside of the window for administration of such. Current outpatient antiplatelet/anticoagulant regimen: None. Current outpatient anti-lipid regimen: None. Dr Otelia Limes  Recommends aspirin loading, with dose of aspirin suppository 600 mg p.o. x1 administered in the ED. no additional recommendations regarding the need for a course of dual antiplatelet therapy at this time.  Dr.Lindzen Recommends allowance for permissive hypertension for 24 hours from the time of last known normal, with associated parameters as further quantified below.   Plan: Nursing bedside swallow evaluation x 1 now, and will not initiate oral medications or diet until the patient has passed this. Head of the bed at 30 degrees. Neuro checks per protocol. VS per protocol. Will allow for permissive hypertension for 24 hours following onset of acute focal neurologic deficits, a/w last known normal at 10:30 AM on 11/14/2020, with as needed IV antihypertensive medication for systolic blood pressure greater than 180 mmHg per neurology recommendations, as above.  Monitor on telemetry, including monitoring for atrial fibrillation as modifiable risk factor for acute ischemic CVA.  Follow-up result of MRI brain.  MRI of the brain per neurology recommendation.  Bilateral carotid ultrasounds been ordered for the morning.  TTE with bubble study has also been ordered for the morning to evaluate for intracardiac thrombus, septal aneurysm, or septal wall defect.  Check lipid panel and hemoglobin A1c.  PT, OT, and ST consults have been ordered to occur in the morning.  Full dose aspirin p.o. daily has been ordered, with first dose to occur on the morning of 11/15/2020.      #) Elevated troponin: mildly elevated initial troponin of 292, with repeat value trending up slightly to 300. No prior high sensitivity troponin I value available for point of comparison.  Suspect that this mildly elevated troponin is on the basis of supply demand mismatch  in the setting of potential previously undiagnosed paroxysmal atrial fibrillation given evidence of biphasic P waves seen on telemetry in the ED this evening, as opposed to representing a type I process due to acute plaque rupture. Additionally, elevated presenting creatinine, of unclear chronicity, may also be contributing to presenting troponin elevation due to associated relative decline in renal clearance.  EKG shows sinus rhythm with PVCs, without evidence of acute ischemic changes, including no evidence of STEMI, as further described above, and chest x-ray shows potential chronic findings in the form of coarsely thickened bilateral interstitial markings without evidence of pleural effusion or pneumothorax.  Additionally, presentation is not associate with any chest pain.  Overall, ACS is felt to be less likely relative to type II supply demand mismatch, as above, will closely monitor on telemetry overnight.  Additionally, cardiology  has been consulted for additional recommendations regarding presenting elevated troponin as well as potential biphasic P waves in the setting presenting suspected acute ischemic stroke.  Preliminary associated recommendations include echocardiogram, as well as further trending of troponin.  Of note, the patient has received aspirin loading dose in the ED this evening, as further described above.  All of this is in the context of no known history of underlying coronary artery disease and without known CAD risk factors aside from age, although this is within the confines of the patient not having seen a physician in greater than 3 years.   Plan: repeat troponin in the AM. Monitor on telemetry. PRN EKG for development of chest pain. Check serum Mg level and repeat BMP in the morning.  Repeat CBC in the morning.  Echocardiogram has been ordered, as above.  Cardiology consult, as above.  Further evaluation management of suspected presenting acute ischemic stroke, as further described  above.       #) Elevated creatinine: Presenting creatinine noted to be 1.63, no prior serum creatinine data point available in our EMR to establish the chronicity of this finding.  Consequently, it is unclear if the patient has mild underlying chronic kidney disease versus a potential mild acute kidney injury.  He appears relatively euvolemic at this time.  Urinalysis has been ordered, with result currently pending.  Plan: We will follow for urinalysis with microscopy to evaluate for presence of urinary casts.  Add on random urine sodium as well as random urine creatinine.  Monitor strict I's and O's and daily weights.  Attempt avoid nephrotoxic agents.  Repeat BMP in the morning.     #) GERD: The patient knowledges a history of such, but reports that he is not currently on any scheduled or as needed medication as management for this as an outpatient.  Plan: will continue to monitor with consideration of initiation of H2 blocker.      DVT prophylaxis: SCDs Code Status: Full code: Per my discussions with the patient this evening, he wishes to be full code. Family Communication: none Disposition Plan: Per Rounding Team Consults called: Dr. Otelia Limes of neurology and Dr. Cherly Beach of cardiology, as further described above Admission status: Observation; med telemetry.   Of note, this patient was added by me to the following Admit List/Treatment Team:  mcadmits.     PLEASE NOTE THAT DRAGON DICTATION SOFTWARE WAS USED IN THE CONSTRUCTION OF THIS NOTE.   Angie Fava DO [facility] Pager [phone] From 7PM - 7AM   11/14/2020, 8:24 PM

## 2020-11-15 ENCOUNTER — Observation Stay (HOSPITAL_COMMUNITY): Payer: Medicare PPO

## 2020-11-15 DIAGNOSIS — Z515 Encounter for palliative care: Secondary | ICD-10-CM | POA: Diagnosis not present

## 2020-11-15 DIAGNOSIS — G8191 Hemiplegia, unspecified affecting right dominant side: Secondary | ICD-10-CM | POA: Diagnosis present

## 2020-11-15 DIAGNOSIS — I639 Cerebral infarction, unspecified: Secondary | ICD-10-CM | POA: Diagnosis present

## 2020-11-15 DIAGNOSIS — Z7982 Long term (current) use of aspirin: Secondary | ICD-10-CM | POA: Diagnosis not present

## 2020-11-15 DIAGNOSIS — R4701 Aphasia: Secondary | ICD-10-CM | POA: Diagnosis present

## 2020-11-15 DIAGNOSIS — I6389 Other cerebral infarction: Secondary | ICD-10-CM | POA: Diagnosis not present

## 2020-11-15 DIAGNOSIS — Z87891 Personal history of nicotine dependence: Secondary | ICD-10-CM | POA: Diagnosis not present

## 2020-11-15 DIAGNOSIS — I10 Essential (primary) hypertension: Secondary | ICD-10-CM | POA: Diagnosis present

## 2020-11-15 DIAGNOSIS — Z6831 Body mass index (BMI) 31.0-31.9, adult: Secondary | ICD-10-CM | POA: Diagnosis not present

## 2020-11-15 DIAGNOSIS — D6489 Other specified anemias: Secondary | ICD-10-CM | POA: Diagnosis present

## 2020-11-15 DIAGNOSIS — E669 Obesity, unspecified: Secondary | ICD-10-CM | POA: Diagnosis present

## 2020-11-15 DIAGNOSIS — J439 Emphysema, unspecified: Secondary | ICD-10-CM | POA: Diagnosis present

## 2020-11-15 DIAGNOSIS — E785 Hyperlipidemia, unspecified: Secondary | ICD-10-CM | POA: Diagnosis present

## 2020-11-15 DIAGNOSIS — R778 Other specified abnormalities of plasma proteins: Secondary | ICD-10-CM | POA: Diagnosis not present

## 2020-11-15 DIAGNOSIS — I248 Other forms of acute ischemic heart disease: Secondary | ICD-10-CM | POA: Diagnosis present

## 2020-11-15 DIAGNOSIS — R2990 Unspecified symptoms and signs involving the nervous system: Secondary | ICD-10-CM

## 2020-11-15 DIAGNOSIS — Z20822 Contact with and (suspected) exposure to covid-19: Secondary | ICD-10-CM | POA: Diagnosis present

## 2020-11-15 DIAGNOSIS — I6523 Occlusion and stenosis of bilateral carotid arteries: Secondary | ICD-10-CM | POA: Diagnosis not present

## 2020-11-15 DIAGNOSIS — E119 Type 2 diabetes mellitus without complications: Secondary | ICD-10-CM | POA: Diagnosis present

## 2020-11-15 DIAGNOSIS — F419 Anxiety disorder, unspecified: Secondary | ICD-10-CM | POA: Diagnosis present

## 2020-11-15 DIAGNOSIS — I63512 Cerebral infarction due to unspecified occlusion or stenosis of left middle cerebral artery: Secondary | ICD-10-CM | POA: Diagnosis present

## 2020-11-15 DIAGNOSIS — I63033 Cerebral infarction due to thrombosis of bilateral carotid arteries: Secondary | ICD-10-CM | POA: Diagnosis not present

## 2020-11-15 DIAGNOSIS — Z7189 Other specified counseling: Secondary | ICD-10-CM | POA: Diagnosis not present

## 2020-11-15 DIAGNOSIS — G928 Other toxic encephalopathy: Secondary | ICD-10-CM | POA: Diagnosis present

## 2020-11-15 DIAGNOSIS — Z66 Do not resuscitate: Secondary | ICD-10-CM | POA: Diagnosis present

## 2020-11-15 DIAGNOSIS — K219 Gastro-esophageal reflux disease without esophagitis: Secondary | ICD-10-CM | POA: Diagnosis present

## 2020-11-15 LAB — BASIC METABOLIC PANEL
Anion gap: 12 (ref 5–15)
BUN: 18 mg/dL (ref 8–23)
CO2: 19 mmol/L — ABNORMAL LOW (ref 22–32)
Calcium: 9.1 mg/dL (ref 8.9–10.3)
Chloride: 108 mmol/L (ref 98–111)
Creatinine, Ser: 1.43 mg/dL — ABNORMAL HIGH (ref 0.61–1.24)
GFR, Estimated: 48 mL/min — ABNORMAL LOW (ref >60)
Glucose, Bld: 98 mg/dL (ref 70–99)
Potassium: 4.4 mmol/L (ref 3.5–5.1)
Sodium: 139 mmol/L (ref 135–145)

## 2020-11-15 LAB — LIPID PANEL
Cholesterol: 156 mg/dL (ref 0–200)
HDL: 52 mg/dL (ref >40)
LDL Cholesterol: 92 mg/dL (ref 0–99)
Total CHOL/HDL Ratio: 3 RATIO
Triglycerides: 58 mg/dL (ref <150)
VLDL: 12 mg/dL (ref 0–40)

## 2020-11-15 LAB — CBC
HCT: 39.3 % (ref 39.0–52.0)
Hemoglobin: 12.9 g/dL — ABNORMAL LOW (ref 13.0–17.0)
MCH: 31.2 pg (ref 26.0–34.0)
MCHC: 32.8 g/dL (ref 30.0–36.0)
MCV: 95.2 fL (ref 80.0–100.0)
Platelets: 149 10*3/uL — ABNORMAL LOW (ref 150–400)
RBC: 4.13 MIL/uL — ABNORMAL LOW (ref 4.22–5.81)
RDW: 13.4 % (ref 11.5–15.5)
WBC: 9.4 10*3/uL (ref 4.0–10.5)
nRBC: 0 % (ref 0.0–0.2)

## 2020-11-15 LAB — RAPID URINE DRUG SCREEN, HOSP PERFORMED
Amphetamines: NOT DETECTED
Barbiturates: NOT DETECTED
Benzodiazepines: NOT DETECTED
Cocaine: NOT DETECTED
Opiates: POSITIVE — AB
Tetrahydrocannabinol: NOT DETECTED

## 2020-11-15 LAB — MAGNESIUM: Magnesium: 2 mg/dL (ref 1.7–2.4)

## 2020-11-15 LAB — HEMOGLOBIN A1C
Hgb A1c MFr Bld: 4.8 % (ref 4.8–5.6)
Mean Plasma Glucose: 91.06 mg/dL

## 2020-11-15 LAB — TROPONIN I (HIGH SENSITIVITY): Troponin I (High Sensitivity): 248 ng/L (ref <18)

## 2020-11-15 IMAGING — CT CT ANGIO HEAD
1 of 8 series · 14 of 47 positions shown · IV contrast (OMNI)
Comparison: None.

CLINICAL DATA: Bilateral ICA occlusion show by MRI. Small left
hemisphere infarctions.

EXAM:
CT ANGIOGRAPHY HEAD AND NECK
TECHNIQUE: Multidetector CT imaging of the head and neck was performed using
the standard protocol during bolus administration of intravenous
contrast. Multiplanar CT image reconstructions and MIPs were
obtained to evaluate the vascular anatomy. Carotid stenosis
measurements (when applicable) are obtained utilizing NASCET
criteria, using the distal internal carotid diameter as the
denominator.
CONTRAST:  60mL OMNIPAQUE IOHEXOL 350 MG/ML SOLN

[Series 14: thin · axial · 0.54mm/px · z∈[-262,+43]mm · 14 of 703 slices shown]
[im 47/703  brain]
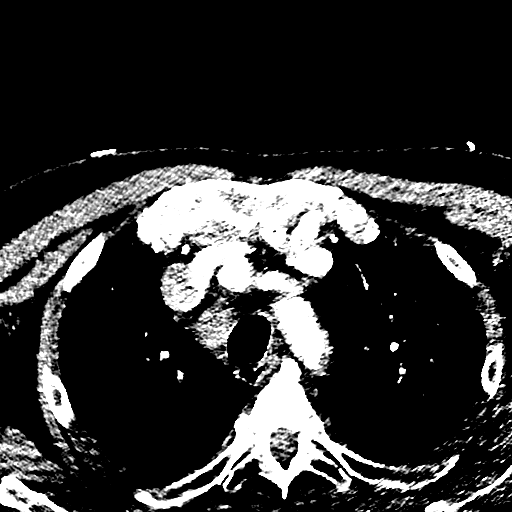
[im 94/703  bone]
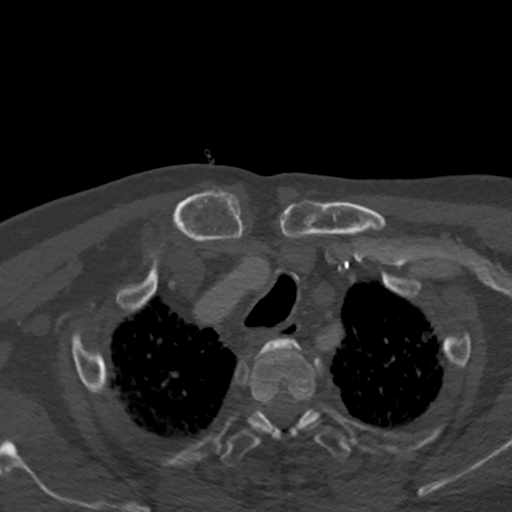
[im 141/703  brain]
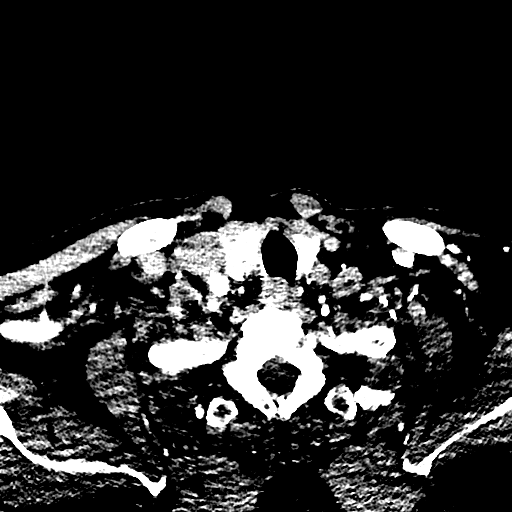
[im 188/703  bone]
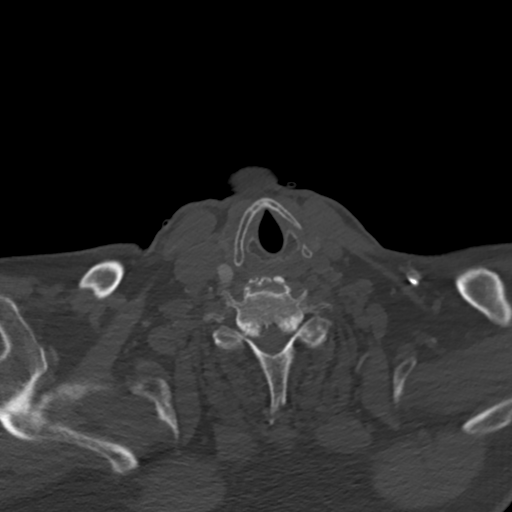
[im 235/703  brain]
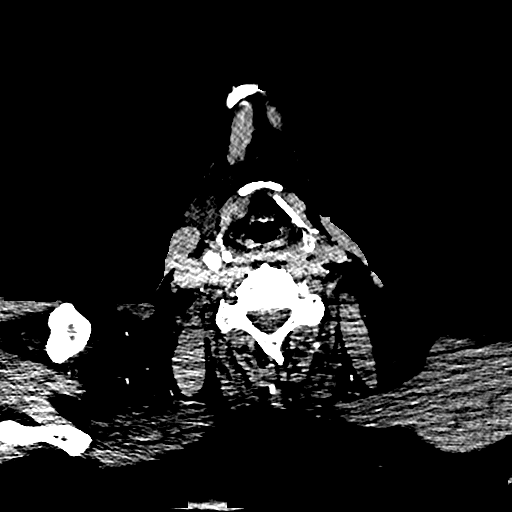
[im 281/703  bone]
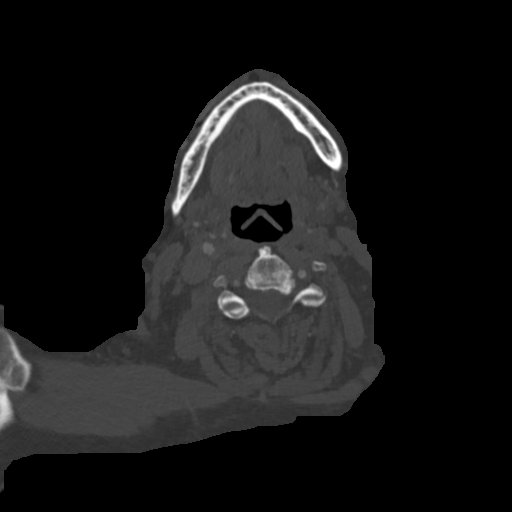
[im 328/703  brain]
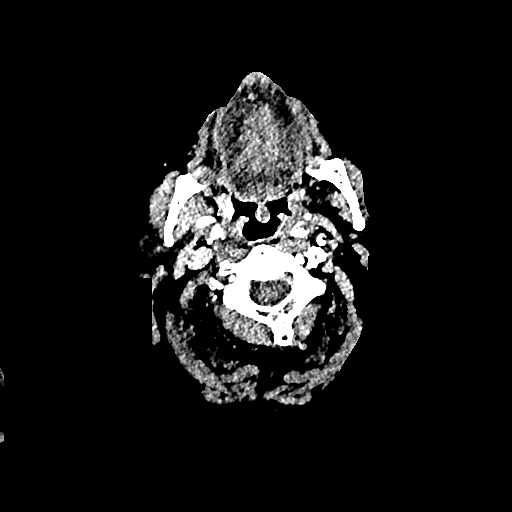
[im 375/703  bone]
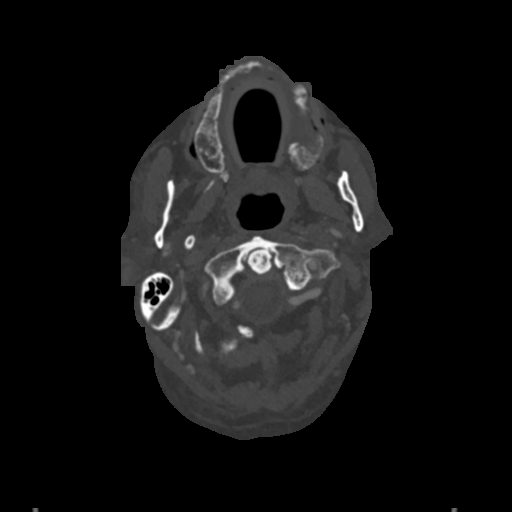
[im 422/703  brain]
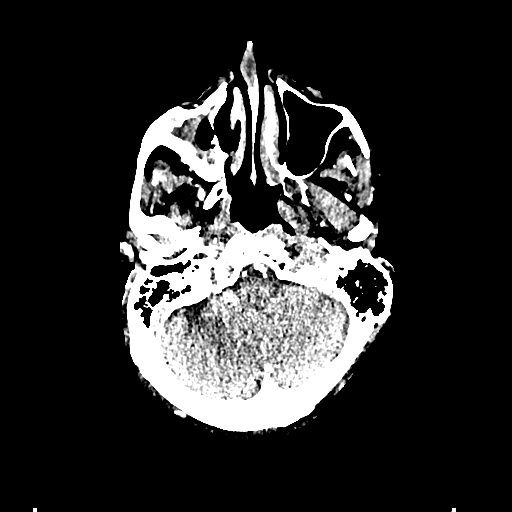
[im 469/703  bone]
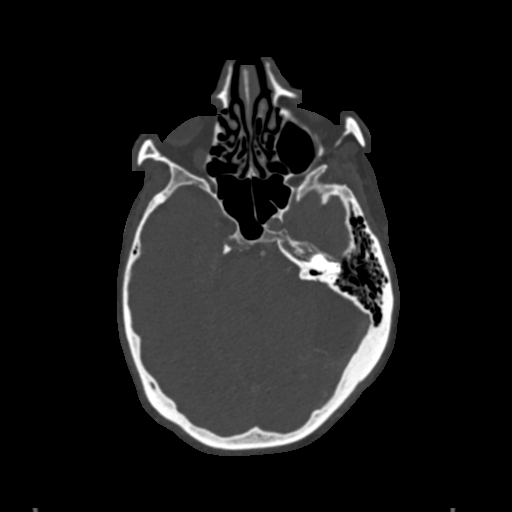
[im 515/703  brain]
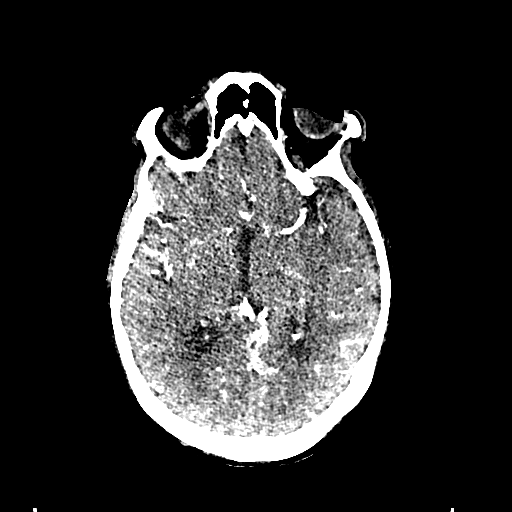
[im 562/703  bone]
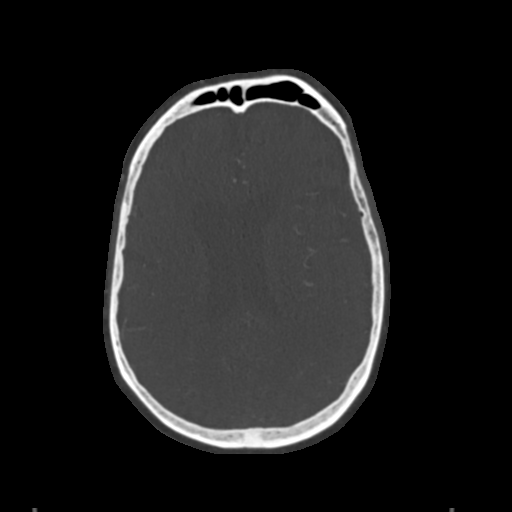
[im 609/703  brain]
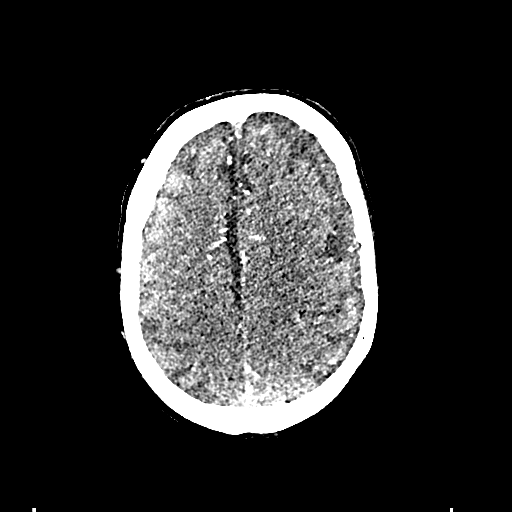
[im 656/703  bone]
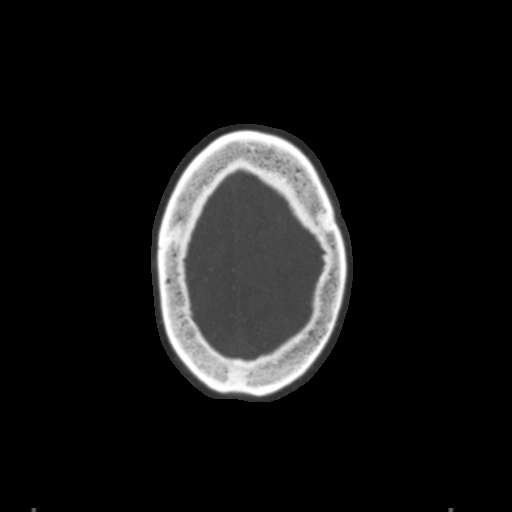

[14 of 47 positions shown; findings below may reference images not displayed]

FINDINGS: CTA NECK FINDINGS

Aortic arch: Aortic atherosclerosis.  Branching pattern is normal.

Right carotid system: Pronounced atherosclerotic change of the
innominate artery. Right common carotid artery shows intimal
thickening but is patent to the bifurcation. Severe soft plaque at
the bifurcation with occlusion of the ICA at its origin. Severe
stenosis of the proximal right ECA. No reconstitution in the neck.

Left carotid system: Common carotid artery occluded immediately
beyond the origin. No flow seen beyond that to the region of the
bifurcation. There is some reconstituted flow in the external
carotid artery. No flow in the cervical internal carotid artery.

Vertebral arteries: Left vertebral artery origin is widely patent.
The left vertebral artery is patent through the cervical region to
the foramen magnum. The right vertebral artery is occluded at its
origin but reconstituted as a small vessel by cervical collaterals.
Some antegrade flow occurs to the level of the foramen magnum.

Skeleton: Ordinary cervical spondylosis.

Other neck: No neck mass or neck lymphadenopathy.

Upper chest: Patient has mediastinal adenopathy in the Peri carinal
region and right paratracheal region including a necrotic appearing
node measuring 2.5 cm at the lower right paratracheal region. The
lungs show emphysema and scarring. There is either fluid in the
major fissure on the right or some consolidation of the dorsal
aspect of the inferior right upper lobe. Complete chest evaluation
suggested to rule out malignancy.

Review of the MIP images confirms the above findings

CTA HEAD FINDINGS

Anterior circulation: No ICA flow at the skull base. Reconstitution
in the distal siphon regions by external to internal collaterals.
Supraclinoid internal carotid arteries are widely patent. Both
anterior and middle cerebral vessels show flow. No large or medium
vessel occlusion seen.

Posterior circulation: Both vertebral arteries are patent through
the foramen magnum to the basilar. There is some atherosclerotic
disease of the vertebral artery V4 segments but no stenosis greater
than 30%. Basilar artery shows mild atherosclerotic change but no
flow limiting stenosis. Superior cerebellar and posterior cerebral
arteries show flow. There is primary fetal origin of the right PCA.

Venous sinuses: Patent and normal.

Anatomic variants: None significant.

Review of the MIP images confirms the above findings
IMPRESSION: 1. Atherosclerotic disease of the aortic arch.
2. Occlusion of the left common carotid artery immediately beyond
the origin. Reconstituted flow in the left external carotid artery.
No flow in the left cervical internal carotid artery.
3. Occlusion of the right internal carotid artery at the
bifurcation. Severe stenosis of the right external carotid artery.
4. Occlusion of the right vertebral artery at its origin but
reconstituted as a small vessel by cervical collaterals.
5. No intracranial large or medium vessel occlusion or correctable
proximal stenosis.
6. Mediastinal adenopathy, including a necrotic appearing node at
the lower right paratracheal region measuring 2.5 cm. Complete chest
evaluation suggested to rule [DATE]. Emphysema and scarring. Fluid in the major fissure on the right
or some consolidation of the dorsal aspect of the inferior right
upper lobe.
8. Emphysema and aortic atherosclerosis.

Aortic Atherosclerosis ([K6]-[K6]) and Emphysema ([K6]-[K6]).

## 2020-11-15 IMAGING — CT CT ANGIO NECK
1 of 8 series · 12 of 46 positions shown, 17 images · IV contrast (OMNI)
Comparison: None.

CLINICAL DATA: Bilateral ICA occlusion show by MRI. Small left
hemisphere infarctions.

EXAM:
CT ANGIOGRAPHY HEAD AND NECK
TECHNIQUE: Multidetector CT imaging of the head and neck was performed using
the standard protocol during bolus administration of intravenous
contrast. Multiplanar CT image reconstructions and MIPs were
obtained to evaluate the vascular anatomy. Carotid stenosis
measurements (when applicable) are obtained utilizing NASCET
criteria, using the distal internal carotid diameter as the
denominator.
CONTRAST:  60mL OMNIPAQUE IOHEXOL 350 MG/ML SOLN

[Series 14: thin · axial · 0.54mm/px · z∈[-263,+44]mm · 12 of 703 slices shown, 17 images]
[im 44/703  soft-tissue]
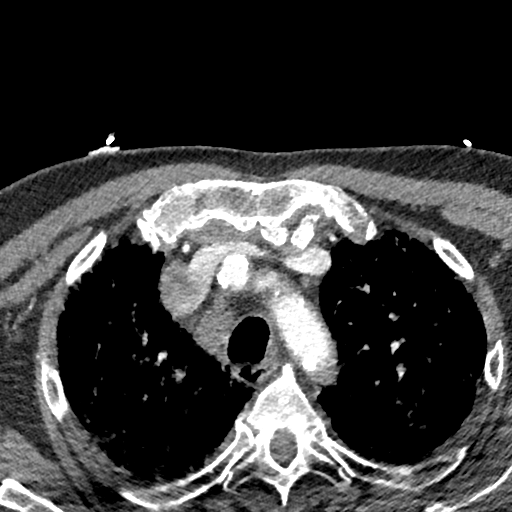
[im 44/703  bone]
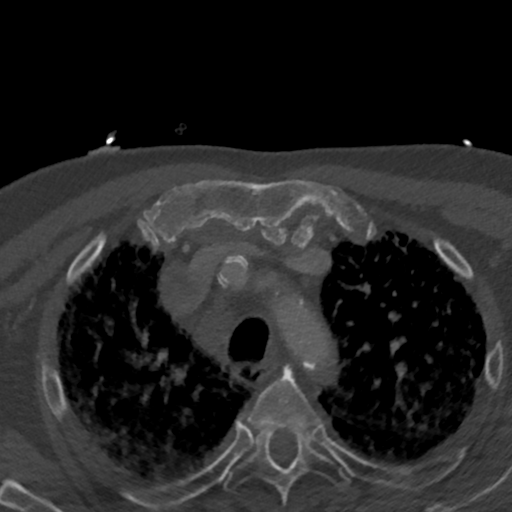
[im 132/703  soft-tissue]
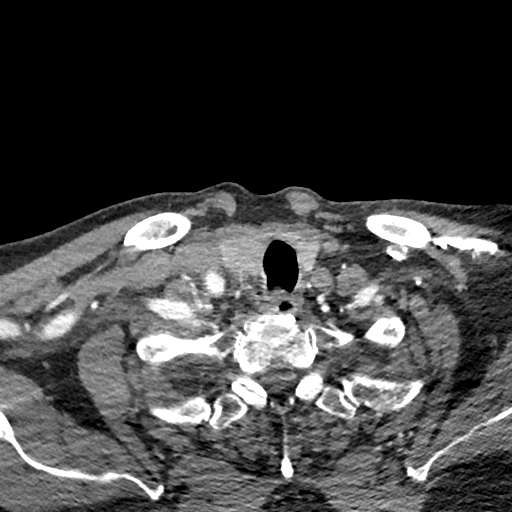
[im 176/703  soft-tissue]
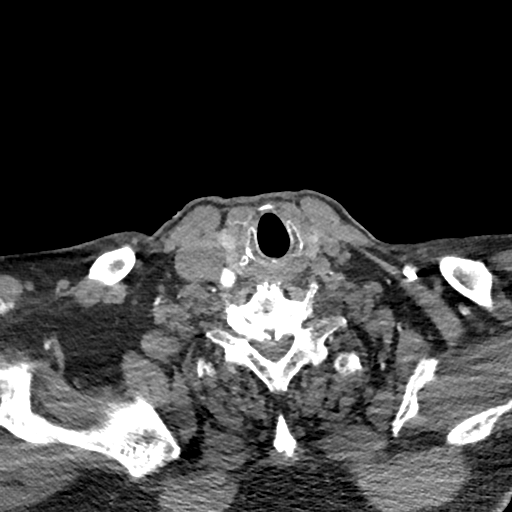
[im 220/703  soft-tissue]
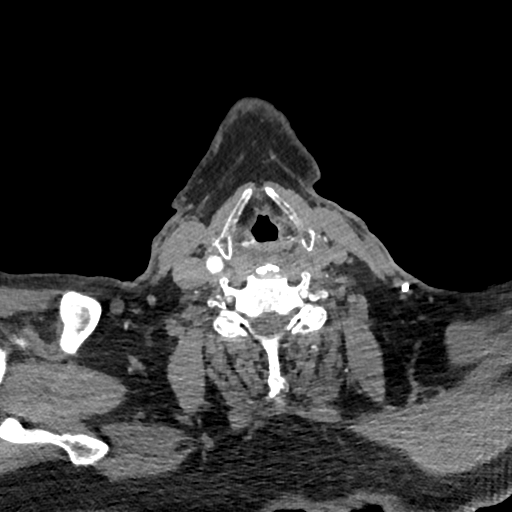
[im 308/703  soft-tissue]
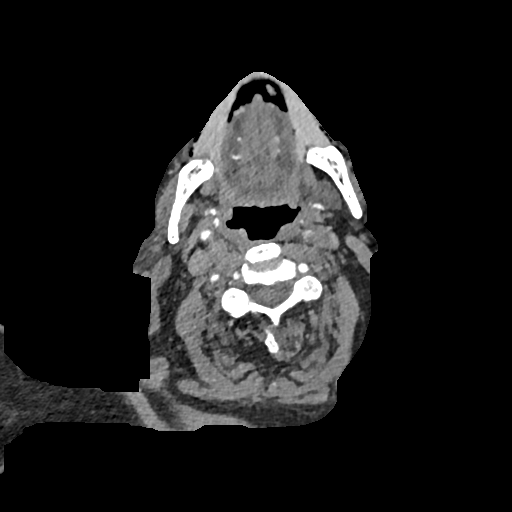
[im 352/703  soft-tissue]
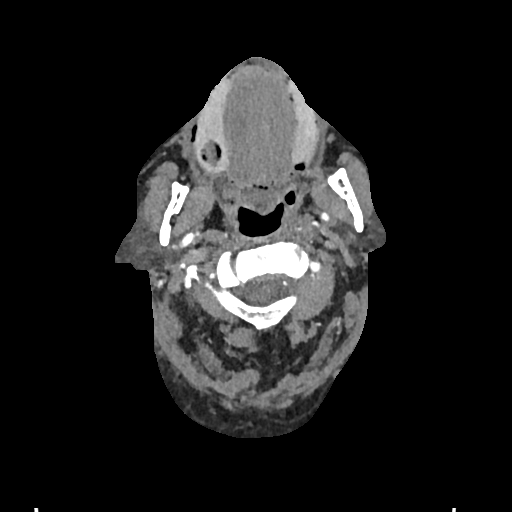
[im 395/703  soft-tissue]
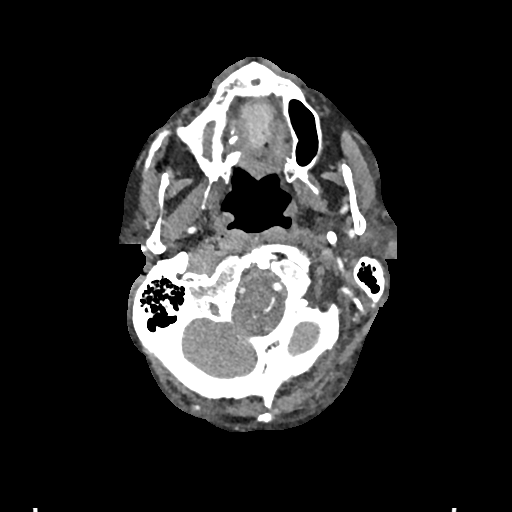
[im 483/703  soft-tissue]
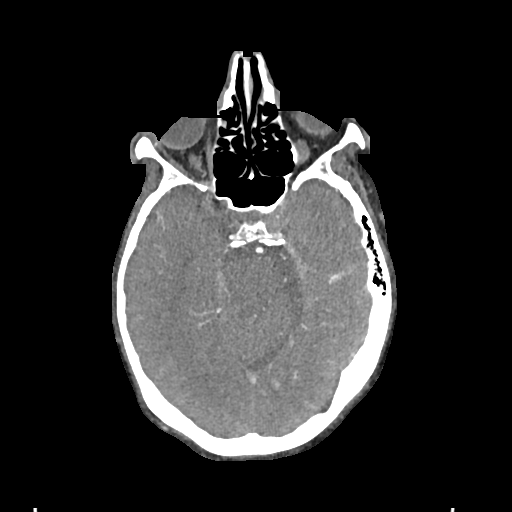
[im 527/703  soft-tissue]
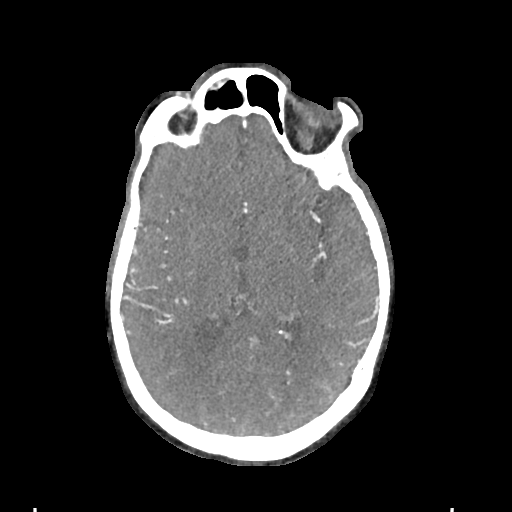
[im 527/703  lung]
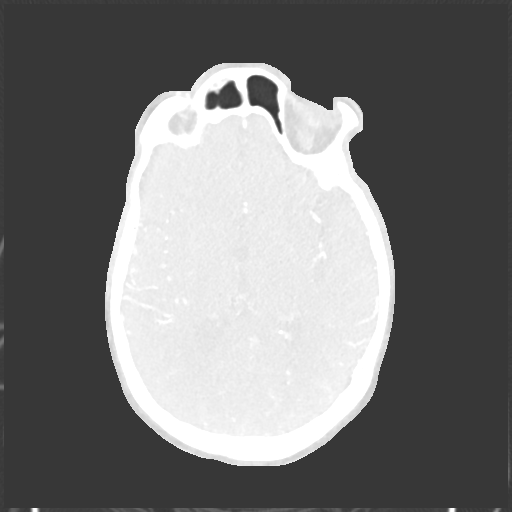
[im 527/703  bone]
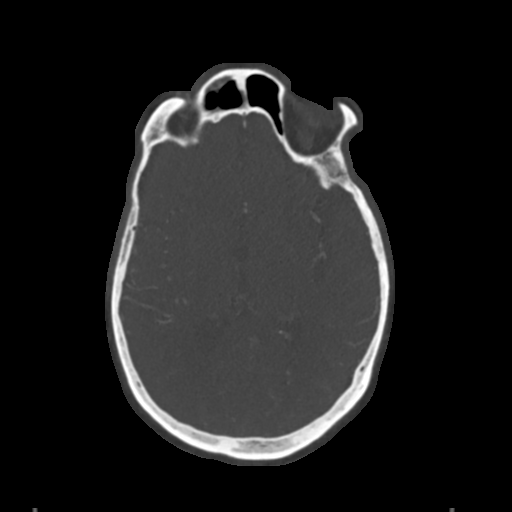
[im 571/703  soft-tissue]
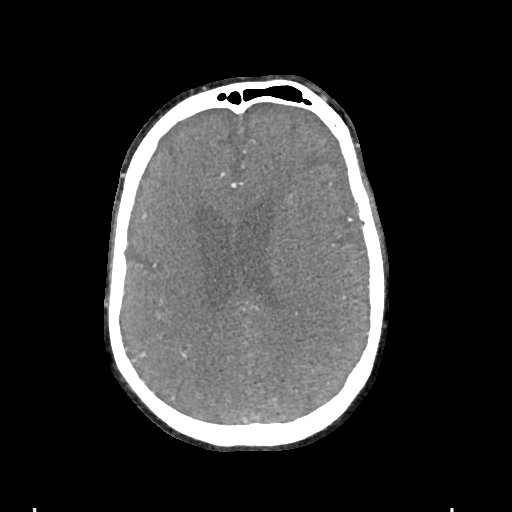
[im 571/703  lung]
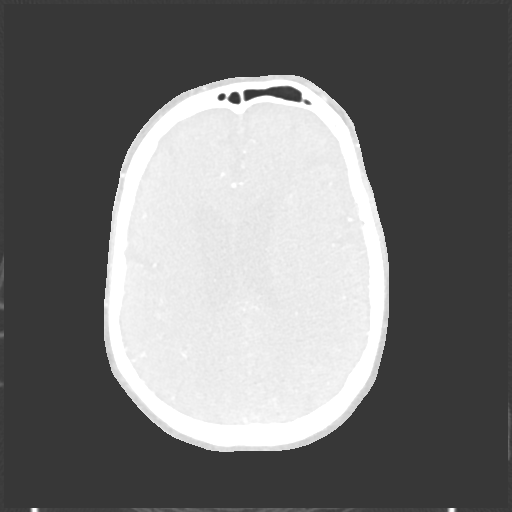
[im 615/703  lung]
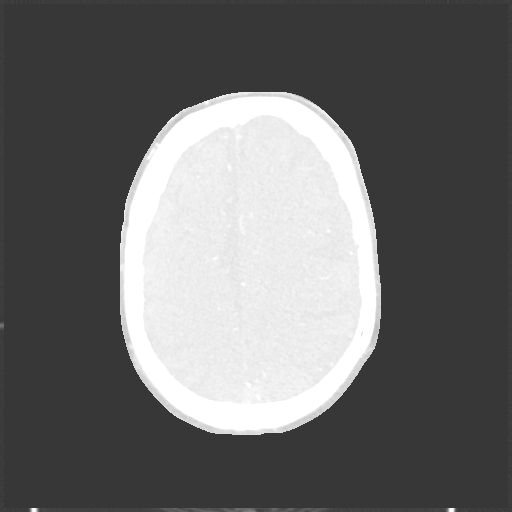
[im 659/703  soft-tissue]
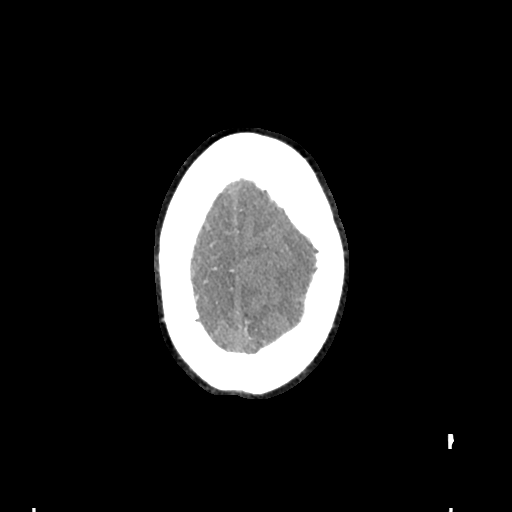
[im 659/703  lung]
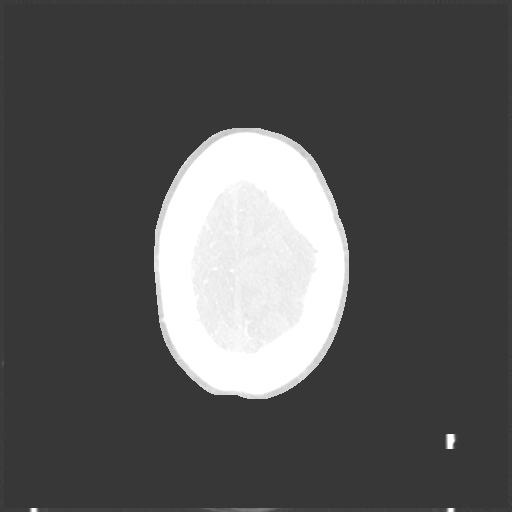

[12 of 46 positions shown; findings below may reference images not displayed]

FINDINGS: CTA NECK FINDINGS

Aortic arch: Aortic atherosclerosis.  Branching pattern is normal.

Right carotid system: Pronounced atherosclerotic change of the
innominate artery. Right common carotid artery shows intimal
thickening but is patent to the bifurcation. Severe soft plaque at
the bifurcation with occlusion of the ICA at its origin. Severe
stenosis of the proximal right ECA. No reconstitution in the neck.

Left carotid system: Common carotid artery occluded immediately
beyond the origin. No flow seen beyond that to the region of the
bifurcation. There is some reconstituted flow in the external
carotid artery. No flow in the cervical internal carotid artery.

Vertebral arteries: Left vertebral artery origin is widely patent.
The left vertebral artery is patent through the cervical region to
the foramen magnum. The right vertebral artery is occluded at its
origin but reconstituted as a small vessel by cervical collaterals.
Some antegrade flow occurs to the level of the foramen magnum.

Skeleton: Ordinary cervical spondylosis.

Other neck: No neck mass or neck lymphadenopathy.

Upper chest: Patient has mediastinal adenopathy in the Peri carinal
region and right paratracheal region including a necrotic appearing
node measuring 2.5 cm at the lower right paratracheal region. The
lungs show emphysema and scarring. There is either fluid in the
major fissure on the right or some consolidation of the dorsal
aspect of the inferior right upper lobe. Complete chest evaluation
suggested to rule out malignancy.

Review of the MIP images confirms the above findings

CTA HEAD FINDINGS

Anterior circulation: No ICA flow at the skull base. Reconstitution
in the distal siphon regions by external to internal collaterals.
Supraclinoid internal carotid arteries are widely patent. Both
anterior and middle cerebral vessels show flow. No large or medium
vessel occlusion seen.

Posterior circulation: Both vertebral arteries are patent through
the foramen magnum to the basilar. There is some atherosclerotic
disease of the vertebral artery V4 segments but no stenosis greater
than 30%. Basilar artery shows mild atherosclerotic change but no
flow limiting stenosis. Superior cerebellar and posterior cerebral
arteries show flow. There is primary fetal origin of the right PCA.

Venous sinuses: Patent and normal.

Anatomic variants: None significant.

Review of the MIP images confirms the above findings
IMPRESSION: 1. Atherosclerotic disease of the aortic arch.
2. Occlusion of the left common carotid artery immediately beyond
the origin. Reconstituted flow in the left external carotid artery.
No flow in the left cervical internal carotid artery.
3. Occlusion of the right internal carotid artery at the
bifurcation. Severe stenosis of the right external carotid artery.
4. Occlusion of the right vertebral artery at its origin but
reconstituted as a small vessel by cervical collaterals.
5. No intracranial large or medium vessel occlusion or correctable
proximal stenosis.
6. Mediastinal adenopathy, including a necrotic appearing node at
the lower right paratracheal region measuring 2.5 cm. Complete chest
evaluation suggested to rule [DATE]. Emphysema and scarring. Fluid in the major fissure on the right
or some consolidation of the dorsal aspect of the inferior right
upper lobe.
8. Emphysema and aortic atherosclerosis.

Aortic Atherosclerosis ([K6]-[K6]) and Emphysema ([K6]-[K6]).

## 2020-11-15 IMAGING — CT CT CHEST W/O CM
2 of 4 series · 15 of 36 positions shown, 18 images · non-contrast
Comparison: [DATE].

CLINICAL DATA: Lung nodule.

EXAM:
CT CHEST WITHOUT CONTRAST
TECHNIQUE: Multidetector CT imaging of the chest was performed following the
standard protocol without IV contrast.

[Series 5: coronal · coronal · 0.70mm/px · 3 of 102 slices shown]
[im 21/102  lung]
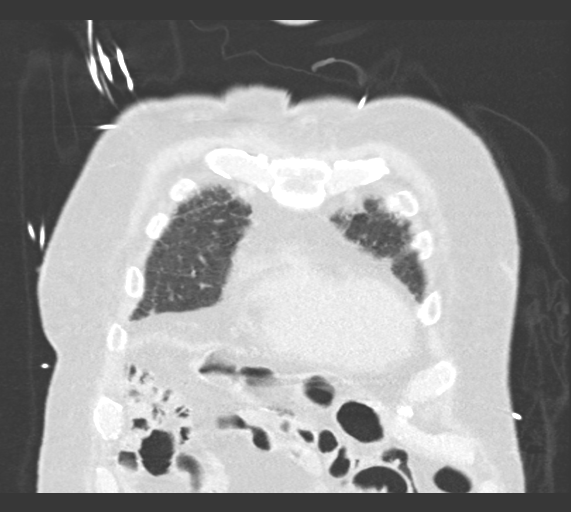
[im 41/102  lung]
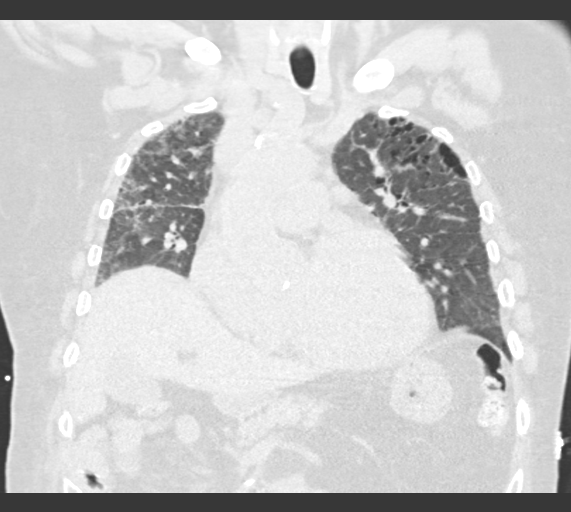
[im 61/102  lung]
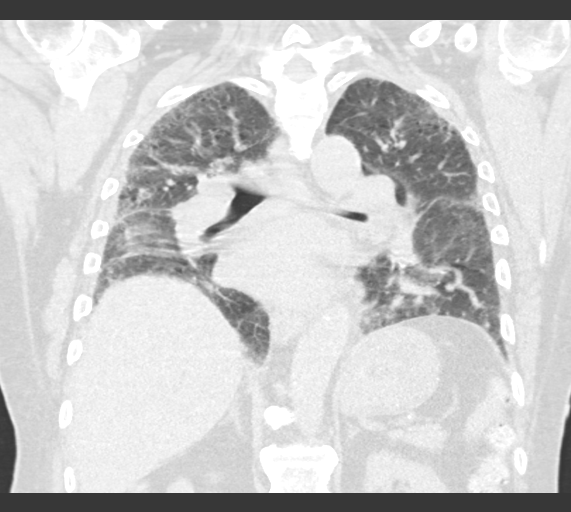

[Series 8: thorax 2.0 · axial · 0.84mm/px · z∈[+1290,+1584]mm · 12 of 165 slices shown, 15 images]
[im 9/165  mediastinal]
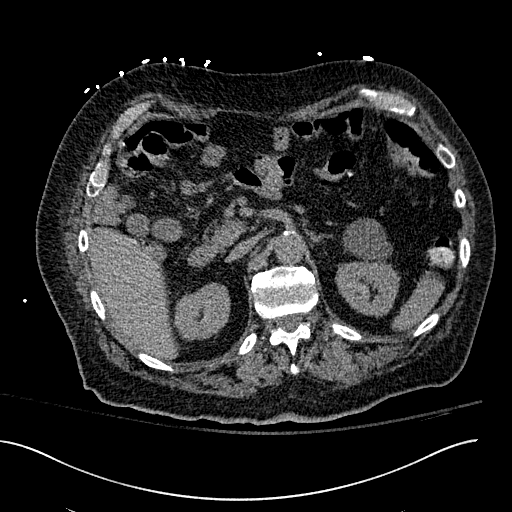
[im 9/165  lung]
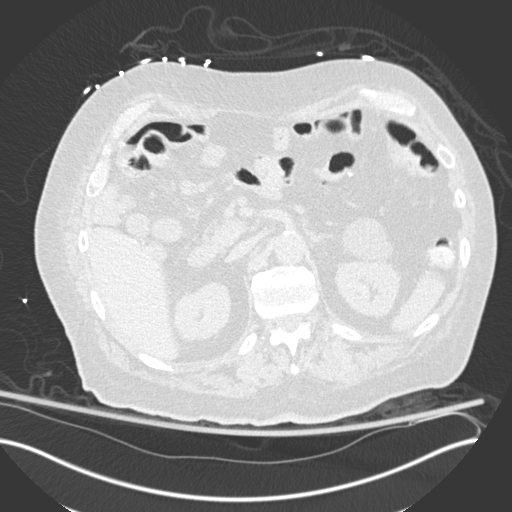
[im 26/165  lung]
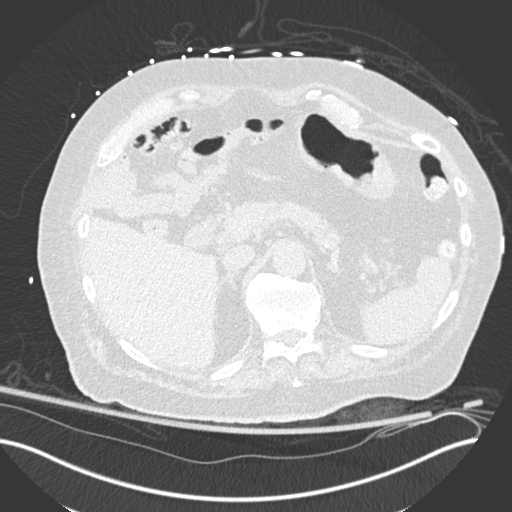
[im 35/165  lung]
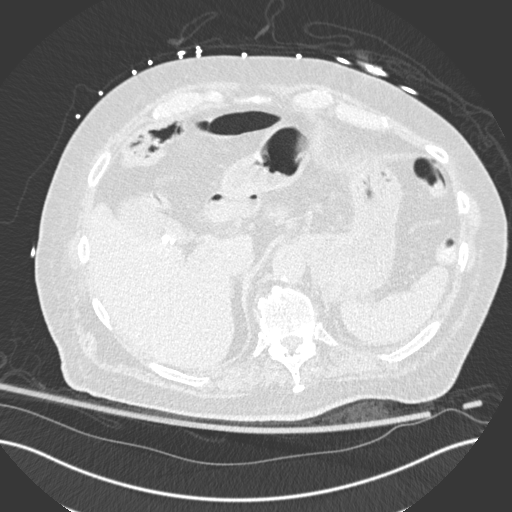
[im 52/165  lung]
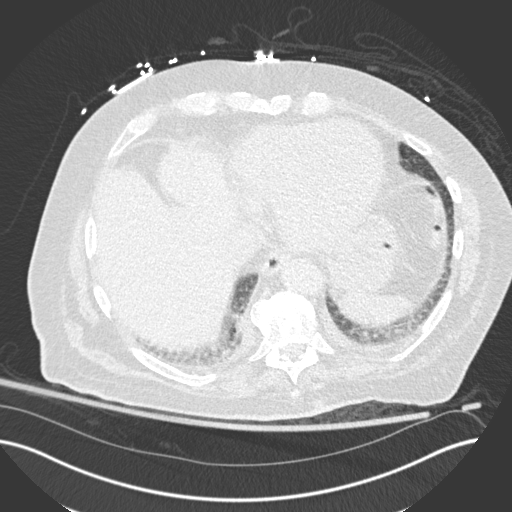
[im 61/165  mediastinal]
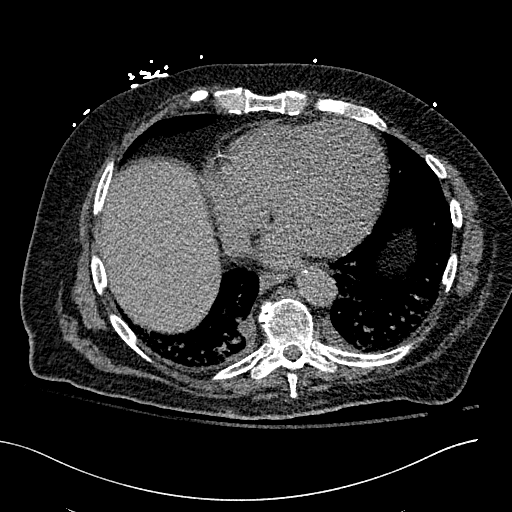
[im 61/165  lung]
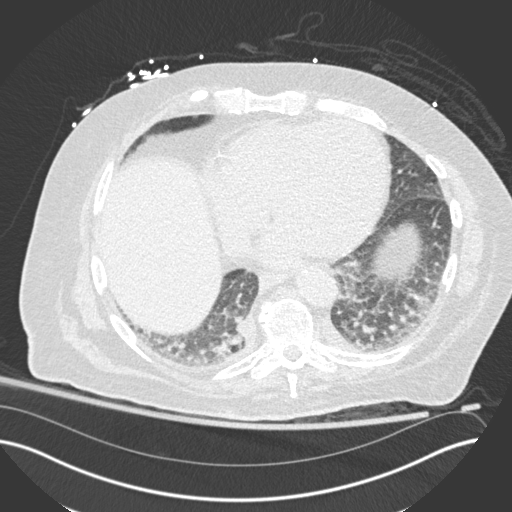
[im 78/165  lung]
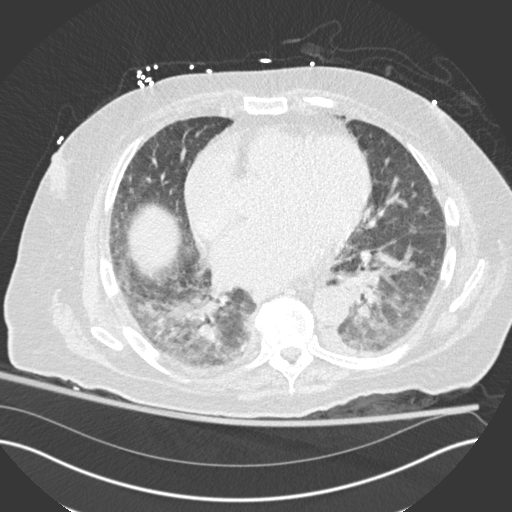
[im 87/165  lung]
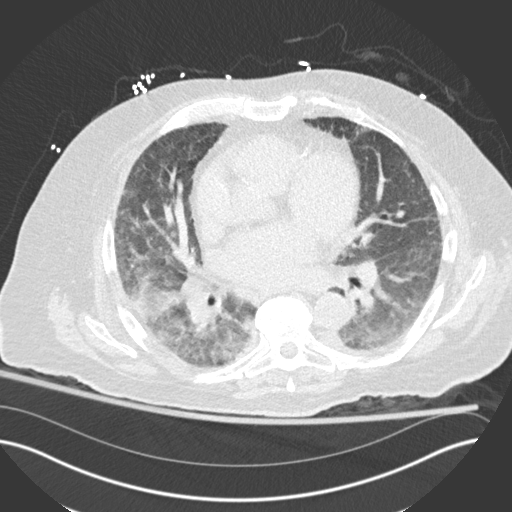
[im 104/165  lung]
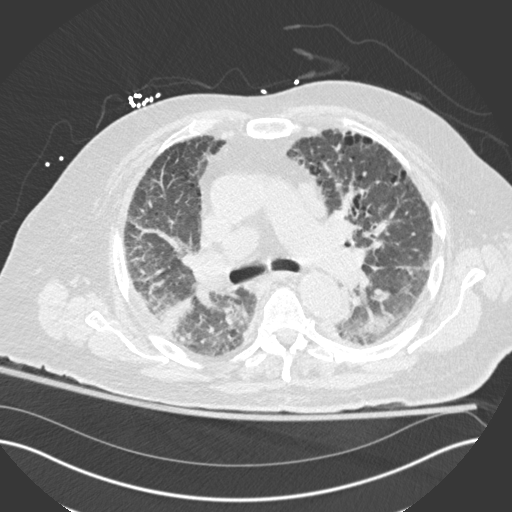
[im 113/165  mediastinal]
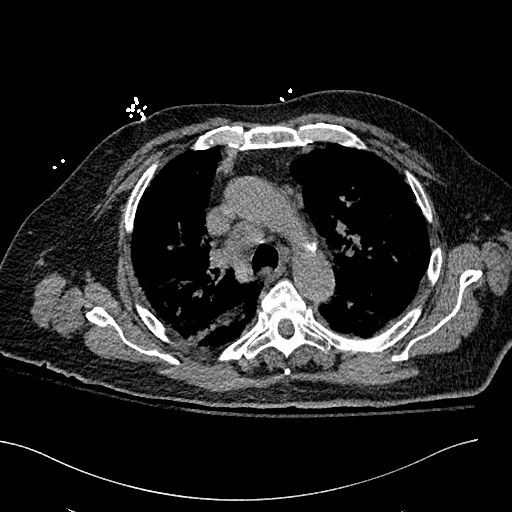
[im 113/165  lung]
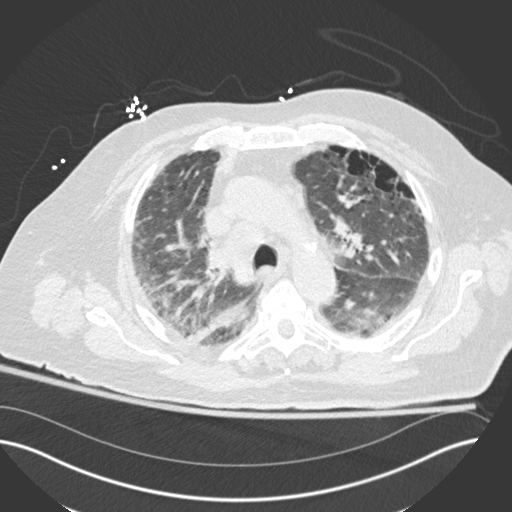
[im 130/165  lung]
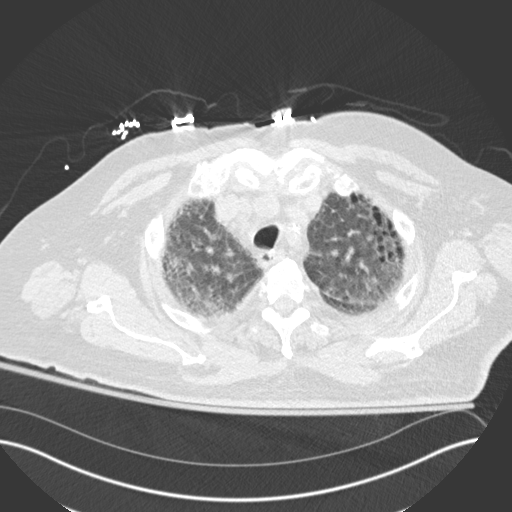
[im 139/165  lung]
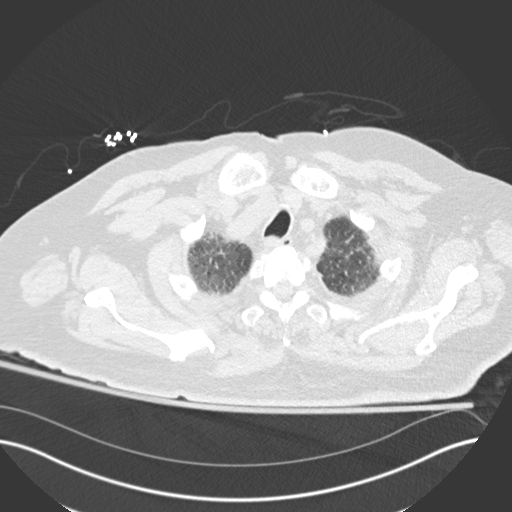
[im 156/165  lung]
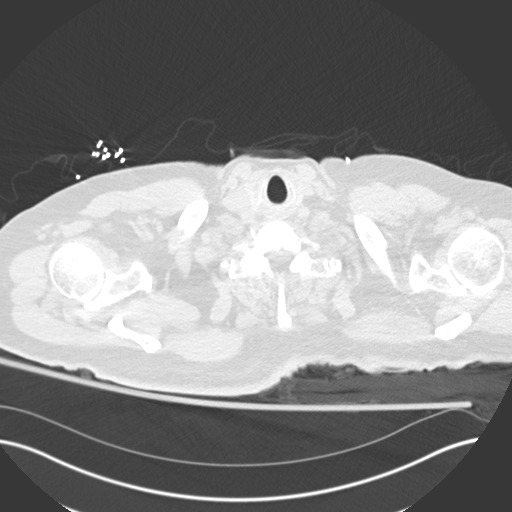

[15 of 36 positions shown; findings below may reference images not displayed]

FINDINGS: Cardiovascular: Atherosclerosis of thoracic aorta is noted without
aneurysm formation. Mild cardiomegaly is noted. No pericardial
effusion is noted. Coronary artery calcifications are noted.

Mediastinum/Nodes: Thyroid gland and esophagus are unremarkable.
cm right paratracheal lymph node is noted. 2 cm precarinal lymph
node is noted. 12 mm AP window lymph node is noted.

Lungs/Pleura: No pneumothorax is noted. Minimal pleural effusions
are noted. Emphysematous disease is noted in both upper lobes.
Interstitial densities are noted throughout both lungs which may
represent pulmonary edema, although atypical infection or scarring
cannot be excluded. 7 mm nodule is noted in the left lung apex best
seen on image number 32 of series 7.

Upper Abdomen: Cholelithiasis is noted. Nonobstructive right renal
calculus is noted.

Musculoskeletal: No chest wall mass or suspicious bone lesions
identified.
IMPRESSION: 1. 7 mm nodule is noted in the left lung apex. Non-contrast chest CT
at 6-12 months is recommended. If the nodule is stable at time of
repeat CT, then future CT at 18-24 months (from today's scan) is
considered optional for low-risk patients, but is recommended for
high-risk patients. This recommendation follows the consensus
statement: Guidelines for Management of Incidental Pulmonary Nodules
Detected on CT Images: From the [HOSPITAL] [Q8]; Radiology
[Q8]; [DATE].
2. Interstitial densities are noted throughout both lungs which may
represent pulmonary edema, although atypical infection or scarring
cannot be excluded.
3. Minimal bilateral pleural effusions are noted.
4. Enlarged mediastinal adenopathy is noted which may be
inflammatory or neoplastic in etiology. Clinical correlation is
recommended.
5. Coronary artery calcifications are noted.
6. Cholelithiasis.
7. Nonobstructive right renal calculus.
8. Emphysema and aortic atherosclerosis.

Aortic Atherosclerosis ([Q8]-[Q8]) and Emphysema ([Q8]-[Q8]).

## 2020-11-15 MED ORDER — CLOPIDOGREL BISULFATE 75 MG PO TABS: 300.0000 mg | ORAL_TABLET | Freq: Once | ORAL | Status: DC

## 2020-11-15 MED ORDER — CLOPIDOGREL BISULFATE 75 MG PO TABS: 75.0000 mg | ORAL_TABLET | Freq: Every day | ORAL | Status: DC

## 2020-11-15 MED ORDER — ASPIRIN 81 MG PO CHEW: 81.0000 mg | CHEWABLE_TABLET | Freq: Every day | ORAL | Status: DC

## 2020-11-15 MED ORDER — PERFLUTREN LIPID MICROSPHERE
1.0000 mL | INTRAVENOUS | Status: AC | PRN
  Administered 2020-11-15: 2 mL via INTRAVENOUS
  Filled 2020-11-15: qty 10

## 2020-11-15 MED ORDER — ASPIRIN 300 MG RE SUPP
300.0000 mg | Freq: Every day | RECTAL | Status: DC
  Administered 2020-11-16: 300 mg via RECTAL
  Filled 2020-11-15: qty 1

## 2020-11-15 MED ORDER — ENOXAPARIN SODIUM 40 MG/0.4ML ~~LOC~~ SOLN
40.0000 mg | SUBCUTANEOUS | Status: DC
  Administered 2020-11-15: 40 mg via SUBCUTANEOUS
  Filled 2020-11-15: qty 0.4

## 2020-11-15 MED ORDER — IOHEXOL 350 MG/ML SOLN
60.0000 mL | Freq: Once | INTRAVENOUS | Status: AC | PRN
  Administered 2020-11-15: 60 mL via INTRAVENOUS

## 2020-11-15 MED ORDER — ATORVASTATIN CALCIUM 80 MG PO TABS: 80.0000 mg | ORAL_TABLET | Freq: Every day | ORAL | Status: DC

## 2020-11-15 NOTE — Progress Notes (Signed)
PROGRESS NOTE    Martin Collins  ZOX:[id number] DOB: [birth year] DOA: 11/14/2020 PCP: Patient, No Pcp Per    Chief Complaint  Patient presents with  . Code Stroke    Brief Narrative:  87 year old gentleman prior history of GERD past tobacco abuse initially presents to [facility] on 11/14/2020 with suspected acute ischemic stroke.  Patient had an acute onset of left monocular visual blurring, bilateral upper extremity tremors and falling to the right.  He was brought to ED as code stroke was seen by neurology.  He was deemed not a candidate for TPA as he is out of the time window.  Initial CT of the head findings not consistent with LVO.  MRI of the brain shows Small acute infarcts within the left parietal cortex and left frontoparietal white matter. CT angio of the head and neck showed Occlusion of the left common carotid artery immediately beyond the origin. Reconstituted flow in the left external carotid artery. No flow in the left cervical internal carotid artery.  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 Severe stenosis of the right external carotid artery.Occlusion of the right vertebral artery at its origin but reconstituted as a small vessel by cervical collaterals.   EKG was abnormal with biphasic P waves and troponins elevated.  Cardiology consulted for evaluation of paroxysmal atrial fibrillation.  Cardiology recommends TTE with bubble study to evaluate for intracardiac thrombus and  outpatient monitor to assess for atrial fibrillation given his acute CVA.  CT angio of the neck also revealed mediastinal adenopathy in the Peri carinal region and right paratracheal region including a necrotic appearing node measuring 2.5 cm at the lower right paratracheal region. Will get a CT chest without contrast for further evaluation.   He was started on loading dose of aspirin  followed by aspirin 325 mg daily.  Further stroke work up in progress.    Assessment &  Plan:   Principal Problem:   Stroke [facility]) Active Problems:   Elevated troponin   Elevated serum creatinine   Acid reflux   Small acute infarcts within the left parietal cortex and left frontoparietal white matter/  Occlusion of the left common carotid artery immediately beyond the origin.  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 Severe stenosis of the right external carotid artery. Carotid duplex is pending.  Admitted for further stroke work up.  Resume aspirin 325 mg daily.  Get TTE with bubble study.LDL is 92, Hemoglobin A1c is 4.8. Therapy evaluations are ordered.  Fiance at bedside and reports that he is not back to baseline yet.    Acute toxic and metabolic encephalopathy:  Probably from the strokes. ?  Pt not back to baseline yet.   Permissive Hypertension:    Abnormal lung imaging with mediastinal adenopathy and necrotic lung nodule about 2.5 cm 30 year smoking history and extensive emphysema.  CT chest without contrast to evaluate for malignancy.   Stage 3 a CKD?  Pt hasn't seen a physician in the last 20 years. Unclear if his renal parameters are at baseline vs acute.  Continue to monitor.   Elevated troponins:  Probably from demand ischemia. Further evaluation with TTE and cardiology on board.  No chest pain   Mild normocytic anemia:  Continue to monitor.    GERD:  Stable.   DVT prophylaxis: Lovenox.  Code Status: (Full code) Family Communication: Fiance at bedside.  Disposition:   Status is: Observation  The patient will require care spanning >  2 midnights and should be moved to inpatient because: Ongoing diagnostic testing needed not appropriate for outpatient work up and Unsafe d/c plan  Dispo: The patient is from: Home              Anticipated d/c is to: pending.               Anticipated d/c date is: 2 days              Patient currently is not medically stable to d/c.   Difficult to place patient No        Level of care: Telemetry Medical Consultants:   Neurology  Cardiology.    Procedures:   CT ANGIO HEAD W OR WO CONTRAST MR Brain Wo Contrast (neuro protocol) MR ANGIO  Head and Neck without contrast  CT head  Code stroke.     Antimicrobials: none.    Subjective: Pt appears to be confused.   Objective: Vitals:   11/15/20 0517 11/15/20 0600 11/15/20 0657 11/15/20 0715  BP: 130/70 124/82  (!) 161/73  Pulse: (!) 52 (!) 110  (!) 58  Resp: (!) 21 (!) 29  (!) 26  Temp:   98 F (36.7 C)   TempSrc:   Oral   SpO2: 93% 100%  98%  Weight:      Height:       No intake or output data in the 24 hours ending 11/15/20 0901 Filed Weights   11/14/20 1600 11/14/20 1641  Weight: 114.1 kg 114.1 kg    Examination:  General exam: Appears calm and comfortable on 2lit of [place] oxygen.  Respiratory system: Clear to auscultation. Respiratory effort normal. Cardiovascular system: S1 & S2 heard, RRR. Marland Kitchen No pedal edema. Gastrointestinal system: Abdomen is nondistended, soft and nontender. Normal bowel sounds heard. Central nervous system: Alert and disoriented,  Extremities: no pedal edema.  Skin: No rashes, lesions or ulcers Psychiatry: cannot be assessed. .     Data Reviewed: I have personally reviewed following labs and imaging studies  CBC: Recent Labs  Lab 11/14/20 1610 11/14/20 1614 11/15/20 0414  WBC 8.4  --  9.4  NEUTROABS 5.0  --   --   HGB 15.2 16.0 12.9*  HCT 46.8 47.0 39.3  MCV 94.7  --  95.2  PLT 191  --  149*    Basic Metabolic Panel: Recent Labs  Lab 11/14/20 1610 11/14/20 1614 11/15/20 0351  NA 140 140 139  K 4.1 4.0 4.4  CL 105 107 108  CO2 23  --  19*  GLUCOSE 123* 112* 98  BUN CREATININE 1.63* 1.50* 1.43*  CALCIUM 9.3  --  9.1  MG  --   --  2.0    GFR: Estimated Creatinine Clearance: 52.4 mL/min (A) (by C-G formula based on SCr of 1.43 mg/dL (H)).  Liver Function Tests: Recent Labs  Lab 11/14/20 1610  AST 31  ALT 14  ALKPHOS  78  BILITOT 1.2  PROT 7.3  ALBUMIN 3.8    CBG: Recent Labs  Lab 11/14/20 1608  GLUCAP 116*     Recent Results (from the past 240 hour(s))  SARS CORONAVIRUS 2 (TAT 6-24 HRS) Nasopharyngeal Nasopharyngeal Swab     Status: None   Collection Time: 11/14/20  5:20 PM   Specimen: Nasopharyngeal Swab  Result Value Ref Range Status   SARS Coronavirus 2 NEGATIVE NEGATIVE Final    Comment: (NOTE) SARS-CoV-2 target nucleic acids are NOT DETECTED.  The SARS-CoV-2  RNA is generally detectable in upper and lower respiratory specimens during the acute phase of infection. Negative results do not preclude SARS-CoV-2 infection, do not rule out co-infections with other pathogens, and should not be used as the sole basis for treatment or other patient management decisions. Negative results must be combined with clinical observations, patient history, and epidemiological information. The expected result is Negative.  Fact Sheet for Patients: HairSlick.no  Fact Sheet for Healthcare Providers: quierodirigir.com  This test is not yet approved or cleared by the [place] FDA and  has been authorized for detection and/or diagnosis of SARS-CoV-2 by FDA under an Emergency Use Authorization (EUA). This EUA will remain  in effect (meaning this test can be used) for the duration of the COVID-19 declaration under Se ction 564(b)(1) of the Act, 21 U.S.C. section 360bbb-3(b)(1), unless the authorization is terminated or revoked sooner.  Performed at [facility], [address], [place], [place] [address]          Radiology Studies: CT ANGIO HEAD W OR WO CONTRAST  Result Date: 11/15/2020 CLINICAL DATA:  Bilateral ICA occlusion show by MRI. Small left hemisphere infarctions. EXAM: CT ANGIOGRAPHY HEAD AND NECK TECHNIQUE: Multidetector CT imaging of the head and neck was performed using the standard protocol during bolus administration  of intravenous contrast. Multiplanar CT image reconstructions and MIPs were obtained to evaluate the vascular anatomy. Carotid stenosis measurements (when applicable) are obtained utilizing NASCET criteria, using the distal internal carotid diameter as the denominator. CONTRAST:  60mL OMNIPAQUE IOHEXOL 350 MG/ML SOLN COMPARISON:  None. FINDINGS: CTA NECK FINDINGS Aortic arch: Aortic atherosclerosis.  Branching pattern is normal. Right carotid system: Pronounced atherosclerotic change of the innominate artery. Right common carotid artery shows intimal thickening but is patent to the bifurcation. Severe soft plaque at the bifurcation with occlusion of the ICA at its origin. Severe stenosis of the proximal right ECA. No reconstitution in the neck. Left carotid system: Common carotid artery occluded immediately beyond the origin. No flow seen beyond that to the region of the bifurcation. There is some reconstituted flow in the external carotid artery. No flow in the cervical internal carotid artery. Vertebral arteries: Left vertebral artery origin is widely patent. The left vertebral artery is patent through the cervical region to the foramen magnum. The right vertebral artery is occluded at its origin but reconstituted as a small vessel by cervical collaterals. Some antegrade flow occurs to the level of the foramen magnum. Skeleton: Ordinary cervical spondylosis. Other neck: No neck mass or neck lymphadenopathy. Upper chest: Patient has mediastinal adenopathy in the Peri carinal region and right paratracheal region including a necrotic appearing node measuring 2.5 cm at the lower right paratracheal region. The lungs show emphysema and scarring. There is either fluid in the major fissure on the right or some consolidation of the dorsal aspect of the inferior right upper lobe. Complete chest evaluation suggested to rule out malignancy. Review of the MIP images confirms the above findings CTA HEAD FINDINGS Anterior  circulation: No ICA flow at the skull base. Reconstitution in the distal siphon regions by external to internal collaterals. Supraclinoid internal carotid arteries are widely patent. Both anterior and middle cerebral vessels show flow. No large or medium vessel occlusion seen. Posterior circulation: Both vertebral arteries are patent through the foramen magnum to the basilar. There is some atherosclerotic disease of the vertebral artery V4 segments but no stenosis greater than 30%. Basilar artery shows mild atherosclerotic change but no flow limiting stenosis.  Superior cerebellar and posterior cerebral arteries show flow. There is primary fetal origin of the right PCA. Venous sinuses: Patent and normal. Anatomic variants: None significant. Review of the MIP images confirms the above findings IMPRESSION: 1. Atherosclerotic disease of the aortic arch. 2. 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 Reconstituted flow in the left external carotid artery. No flow in the left cervical internal carotid artery. 3. 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 Severe stenosis of the right external carotid artery. 4. Occlusion of the right vertebral artery at its origin but reconstituted as a small vessel by cervical collaterals. 5. No intracranial large or medium vessel occlusion or correctable proximal stenosis. 6. Mediastinal adenopathy, including a necrotic appearing node at the lower right paratracheal region measuring 2.5 cm. Complete chest evaluation suggested to rule out malignancy. 7. Emphysema and scarring. Fluid in the major fissure on the right or some consolidation of the dorsal aspect of the inferior right upper lobe. 8. Emphysema and aortic atherosclerosis. Aortic Atherosclerosis (ICD10-I70.0) and Emphysema (ICD10-J43.9). Electronically Signed   By: Paulina Fusi M.D.   On: 11/15/2020 00:37   CT ANGIO NECK W OR WO CONTRAST  Result Date: 11/15/2020 CLINICAL  DATA:  Bilateral ICA occlusion show by MRI. Small left hemisphere infarctions. EXAM: CT ANGIOGRAPHY HEAD AND NECK TECHNIQUE: Multidetector CT imaging of the head and neck was performed using the standard protocol during bolus administration of intravenous contrast. Multiplanar CT image reconstructions and MIPs were obtained to evaluate the vascular anatomy. Carotid stenosis measurements (when applicable) are obtained utilizing NASCET criteria, using the distal internal carotid diameter as the denominator. CONTRAST:  60mL OMNIPAQUE IOHEXOL 350 MG/ML SOLN COMPARISON:  None. FINDINGS: CTA NECK FINDINGS Aortic arch: Aortic atherosclerosis.  Branching pattern is normal. Right carotid system: Pronounced atherosclerotic change of the innominate artery. Right common carotid artery shows intimal thickening but is patent to the bifurcation. Severe soft plaque at the bifurcation with occlusion of the ICA at its origin. Severe stenosis of the proximal right ECA. No reconstitution in the neck. Left carotid system: Common carotid artery occluded immediately beyond the origin. No flow seen beyond that to the region of the bifurcation. There is some reconstituted flow in the external carotid artery. No flow in the cervical internal carotid artery. Vertebral arteries: Left vertebral artery origin is widely patent. The left vertebral artery is patent through the cervical region to the foramen magnum. The right vertebral artery is occluded at its origin but reconstituted as a small vessel by cervical collaterals. Some antegrade flow occurs to the level of the foramen magnum. Skeleton: Ordinary cervical spondylosis. Other neck: No neck mass or neck lymphadenopathy. Upper chest: Patient has mediastinal adenopathy in the Peri carinal region and right paratracheal region including a necrotic appearing node measuring 2.5 cm at the lower right paratracheal region. The lungs show emphysema and scarring. There is either fluid in the major  fissure on the right or some consolidation of the dorsal aspect of the inferior right upper lobe. Complete chest evaluation suggested to rule out malignancy. Review of the MIP images confirms the above findings CTA HEAD FINDINGS Anterior circulation: No ICA flow at the skull base. Reconstitution in the distal siphon regions by external to internal collaterals. Supraclinoid internal carotid arteries are widely patent. Both anterior and middle cerebral vessels show flow. No large or medium vessel occlusion seen. Posterior circulation: Both vertebral arteries are patent through the foramen magnum to the basilar. There is some atherosclerotic  disease of the vertebral artery V4 segments but no stenosis greater than 30%. Basilar artery shows mild atherosclerotic change but no flow limiting stenosis. Superior cerebellar and posterior cerebral arteries show flow. There is primary fetal origin of the right PCA. Venous sinuses: Patent and normal. Anatomic variants: None significant. Review of the MIP images confirms the above findings IMPRESSION: 1. Atherosclerotic disease of the aortic arch. 2. 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 Reconstituted flow in the left external carotid artery. No flow in the left cervical internal carotid artery. 3. 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 Severe stenosis of the right external carotid artery. 4. Occlusion of the right vertebral artery at its origin but reconstituted as a small vessel by cervical collaterals. 5. No intracranial large or medium vessel occlusion or correctable proximal stenosis. 6. Mediastinal adenopathy, including a necrotic appearing node at the lower right paratracheal region measuring 2.5 cm. Complete chest evaluation suggested to rule out malignancy. 7. Emphysema and scarring. Fluid in the major fissure on the right or some consolidation of the dorsal aspect of the inferior right upper lobe. 8.  Emphysema and aortic atherosclerosis. Aortic Atherosclerosis (ICD10-I70.0) and Emphysema (ICD10-J43.9). Electronically Signed   By: Paulina Fusi M.D.   On: 11/15/2020 00:37   MR ANGIO HEAD WO CONTRAST  Result Date: 11/14/2020 CLINICAL DATA:  Neuro deficit, acute, stroke suspected. EXAM: MR HEAD WITHOUT CONTRAST MR CIRCLE OF WILLIS WITHOUT CONTRAST MRA OF THE NECK WITHOUT CONTRAST TECHNIQUE: Multiplanar, multiecho pulse sequences of the brain, circle of willis and surrounding structures were obtained without intravenous contrast. Angiographic images of the neck were obtained using MRA technique without intravenous contrast. COMPARISON:  Noncontrast head CT performed earlier today 11/14/2020. FINDINGS: MR HEAD FINDINGS Brain: Intermittently motion degraded examination. Most notably, there is mild-to-moderate motion degradation of the coronal T2 weighted sequence. Mild-to-moderate cerebral atrophy. Small acute cortical infarct within the left parietal lobe (series 5, image 9). Additional small curvilinear acute infarct within the left parietal white matter (for instance as seen on series 5, image 85). There are a few additional punctate acute infarcts within the left frontal lobe white matter (for instance as seen on series 5, image 86). Mild multifocal T2/FLAIR hyperintensity within the cerebral white matter and pons is nonspecific, but compatible with chronic small vessel ischemic disease. Tiny chronic infarct within the right cerebellar hemisphere. Redemonstrated asymmetric CSF intensity prominence overlying the left frontal lobe. However, this appears to reflect asymmetric prominence of the subarachnoid space rather than a subdural collection. Unchanged 4 mm rightward midline shift, indeterminate in etiology. No evidence of intracranial mass. No chronic intracranial blood products. 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 Otherwise reported below. Skull and  upper cervical spine: No focal marrow lesion. Sinuses/Orbits: Visualized orbits show no acute finding. Moderate right maxillary sinus mucosal thickening with associated chronic reactive osteitis. Trace bilateral ethmoid sinus mucosal thickening. 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 Reconstitution of flow related signal within the supraclinoid internal carotid arteries bilaterally. The M1 middle cerebral arteries are patent. Attenuated appearance of multiple proximal left M2 MCA branches. However, no left M2 proximal branch occlusion is identified. The anterior cerebral arteries are patent. Moderate stenosis within the A1 right ACA. No intracranial aneurysm is identified. The intracranial vertebral arteries are patent. The basilar artery is patent. Possible short segment fenestration within the proximal basilar artery. The posterior  cerebral arteries are patent. Fetal origin right posterior cerebral artery. A small left posterior communicating artery is present. MRA NECK FINDINGS Mild intermittent motion degradation. The right CCA is patent to the bifurcation without significant stenosis. The right ICA is poorly delineated shortly beyond its origin, likely occluded or near occluded. The left CCA is poorly delineated shortly beyond its origin, likely occluded or near occluded. The left ICA is nonvisualized, likely occluded or near occluded. The dominant left vertebral artery is patent within the neck. Apparent moderate stenosis at the origin of this vessel. Flow related signal is seen intermittently within the non dominant cervical right vertebral artery. These results were called by telephone at the time of interpretation on 11/14/2020 at 11:06 pm to provider Dr. Wilford Corner, who verbally acknowledged these results. IMPRESSION: MRI brain: 1. Intermittently motion degraded examination. 2. Small acute infarcts within the left  parietal cortex and left frontoparietal white matter, as described. 3. Background mild-to-moderate cerebral atrophy and mild chronic small vessel ischemic disease. 4. Tiny chronic right cerebellar infarct. 5. Redemonstrated asymmetric CSF intensity prominence overlying the left frontal lobe. This appears to reflect asymmetric prominence of the subarachnoid space rather than a subdural collection. MRA neck: 1. The right ICA is poorly delineated shortly beyond its origin, likely occluded or near occluded. 2. The left CCA is nonvisualized except for its very proximal portion. The cervical left ICA is also nonvisualized. These vessels are likely occluded or near occluded. 3. The dominant left vertebral artery is patent within the neck. Apparent moderate stenosis at the origin of this vessel. Flow related signal is seen intermittently within the non-dominant cervical right vertebral artery. MRA head: 1. 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 Reconstitution of the intracranial ICAs at the supraclinoid level. 2. Attenuated appearance of multiple proximal M2 left MCA branches. However, no left M2 proximal branch occlusion is identified. 3. Moderate stenosis within the A1 right ACA. Electronically Signed   By: Jackey Loge DO   On: 11/14/2020 23:10   MR ANGIO NECK WO CONTRAST  Result Date: 11/14/2020 CLINICAL DATA:  Neuro deficit, acute, stroke suspected. EXAM: MR HEAD WITHOUT CONTRAST MR CIRCLE OF WILLIS WITHOUT CONTRAST MRA OF THE NECK WITHOUT CONTRAST TECHNIQUE: Multiplanar, multiecho pulse sequences of the brain, circle of willis and surrounding structures were obtained without intravenous contrast. Angiographic images of the neck were obtained using MRA technique without intravenous contrast. COMPARISON:  Noncontrast head CT performed earlier today 11/14/2020. FINDINGS: MR HEAD FINDINGS Brain: Intermittently motion degraded examination. Most notably,  there is mild-to-moderate motion degradation of the coronal T2 weighted sequence. Mild-to-moderate cerebral atrophy. Small acute cortical infarct within the left parietal lobe (series 5, image 9). Additional small curvilinear acute infarct within the left parietal white matter (for instance as seen on series 5, image 85). There are a few additional punctate acute infarcts within the left frontal lobe white matter (for instance as seen on series 5, image 86). Mild multifocal T2/FLAIR hyperintensity within the cerebral white matter and pons is nonspecific, but compatible with chronic small vessel ischemic disease. Tiny chronic infarct within the right cerebellar hemisphere. Redemonstrated asymmetric CSF intensity prominence overlying the left frontal lobe. However, this appears to reflect asymmetric prominence of the subarachnoid space rather than a subdural collection. Unchanged 4 mm rightward midline shift, indeterminate in etiology. No evidence of intracranial mass. No chronic intracranial blood products. 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 Otherwise reported below. Skull and upper  cervical spine: No focal marrow lesion. Sinuses/Orbits: Visualized orbits show no acute finding. Moderate right maxillary sinus mucosal thickening with associated chronic reactive osteitis. Trace bilateral ethmoid sinus mucosal thickening. 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 Reconstitution of flow related signal within the supraclinoid internal carotid arteries bilaterally. The M1 middle cerebral arteries are patent. Attenuated appearance of multiple proximal left M2 MCA branches. However, no left M2 proximal branch occlusion is identified. The anterior cerebral arteries are patent. Moderate stenosis within the A1 right ACA. No intracranial aneurysm is identified. The intracranial  vertebral arteries are patent. The basilar artery is patent. Possible short segment fenestration within the proximal basilar artery. The posterior cerebral arteries are patent. Fetal origin right posterior cerebral artery. A small left posterior communicating artery is present. MRA NECK FINDINGS Mild intermittent motion degradation. The right CCA is patent to the bifurcation without significant stenosis. The right ICA is poorly delineated shortly beyond its origin, likely occluded or near occluded. The left CCA is poorly delineated shortly beyond its origin, likely occluded or near occluded. The left ICA is nonvisualized, likely occluded or near occluded. The dominant left vertebral artery is patent within the neck. Apparent moderate stenosis at the origin of this vessel. Flow related signal is seen intermittently within the non dominant cervical right vertebral artery. These results were called by telephone at the time of interpretation on 11/14/2020 at 11:06 pm to provider Dr. Wilford Corner, who verbally acknowledged these results. IMPRESSION: MRI brain: 1. Intermittently motion degraded examination. 2. Small acute infarcts within the left parietal cortex and left frontoparietal white matter, as described. 3. Background mild-to-moderate cerebral atrophy and mild chronic small vessel ischemic disease. 4. Tiny chronic right cerebellar infarct. 5. Redemonstrated asymmetric CSF intensity prominence overlying the left frontal lobe. This appears to reflect asymmetric prominence of the subarachnoid space rather than a subdural collection. MRA neck: 1. The right ICA is poorly delineated shortly beyond its origin, likely occluded or near occluded. 2. The left CCA is nonvisualized except for its very proximal portion. The cervical left ICA is also nonvisualized. These vessels are likely occluded or near occluded. 3. The dominant left vertebral artery is patent within the neck. Apparent moderate stenosis at the origin of this vessel.  Flow related signal is seen intermittently within the non-dominant cervical right vertebral artery. MRA head: 1. 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 Reconstitution of the intracranial ICAs at the supraclinoid level. 2. Attenuated appearance of multiple proximal M2 left MCA branches. However, no left M2 proximal branch occlusion is identified. 3. Moderate stenosis within the A1 right ACA. Electronically Signed   By: Jackey Loge DO   On: 11/14/2020 23:10   MR Brain Wo Contrast (neuro protocol)  Result Date: 11/14/2020 CLINICAL DATA:  Neuro deficit, acute, stroke suspected. EXAM: MR HEAD WITHOUT CONTRAST MR CIRCLE OF WILLIS WITHOUT CONTRAST MRA OF THE NECK WITHOUT CONTRAST TECHNIQUE: Multiplanar, multiecho pulse sequences of the brain, circle of willis and surrounding structures were obtained without intravenous contrast. Angiographic images of the neck were obtained using MRA technique without intravenous contrast. COMPARISON:  Noncontrast head CT performed earlier today 11/14/2020. FINDINGS: MR HEAD FINDINGS Brain: Intermittently motion degraded examination. Most notably, there is mild-to-moderate motion degradation of the coronal T2 weighted sequence. Mild-to-moderate cerebral atrophy. Small acute cortical infarct within the left parietal lobe (series 5, image 9). Additional small curvilinear acute infarct within the left parietal  white matter (for instance as seen on series 5, image 85). There are a few additional punctate acute infarcts within the left frontal lobe white matter (for instance as seen on series 5, image 86). Mild multifocal T2/FLAIR hyperintensity within the cerebral white matter and pons is nonspecific, but compatible with chronic small vessel ischemic disease. Tiny chronic infarct within the right cerebellar hemisphere. Redemonstrated asymmetric CSF intensity prominence overlying the left frontal lobe. However, this appears  to reflect asymmetric prominence of the subarachnoid space rather than a subdural collection. Unchanged 4 mm rightward midline shift, indeterminate in etiology. No evidence of intracranial mass. No chronic intracranial blood products. 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 Otherwise reported below. Skull and upper cervical spine: No focal marrow lesion. Sinuses/Orbits: Visualized orbits show no acute finding. Moderate right maxillary sinus mucosal thickening with associated chronic reactive osteitis. Trace bilateral ethmoid sinus mucosal thickening. 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 Reconstitution of flow related signal within the supraclinoid internal carotid arteries bilaterally. The M1 middle cerebral arteries are patent. Attenuated appearance of multiple proximal left M2 MCA branches. However, no left M2 proximal branch occlusion is identified. The anterior cerebral arteries are patent. Moderate stenosis within the A1 right ACA. No intracranial aneurysm is identified. The intracranial vertebral arteries are patent. The basilar artery is patent. Possible short segment fenestration within the proximal basilar artery. The posterior cerebral arteries are patent. Fetal origin right posterior cerebral artery. A small left posterior communicating artery is present. MRA NECK FINDINGS Mild intermittent motion degradation. The right CCA is patent to the bifurcation without significant stenosis. The right ICA is poorly delineated shortly beyond its origin, likely occluded or near occluded. The left CCA is poorly delineated shortly beyond its origin, likely occluded or near occluded. The left ICA is nonvisualized, likely occluded or near occluded. The dominant left vertebral artery is patent within the neck. Apparent moderate stenosis at the origin of this vessel. Flow  related signal is seen intermittently within the non dominant cervical right vertebral artery. These results were called by telephone at the time of interpretation on 11/14/2020 at 11:06 pm to provider Dr. Wilford CornerArora, who verbally acknowledged these results. IMPRESSION: MRI brain: 1. Intermittently motion degraded examination. 2. Small acute infarcts within the left parietal cortex and left frontoparietal white matter, as described. 3. Background mild-to-moderate cerebral atrophy and mild chronic small vessel ischemic disease. 4. Tiny chronic right cerebellar infarct. 5. Redemonstrated asymmetric CSF intensity prominence overlying the left frontal lobe. This appears to reflect asymmetric prominence of the subarachnoid space rather than a subdural collection. MRA neck: 1. The right ICA is poorly delineated shortly beyond its origin, likely occluded or near occluded. 2. The left CCA is nonvisualized except for its very proximal portion. The cervical left ICA is also nonvisualized. These vessels are likely occluded or near occluded. 3. The dominant left vertebral artery is patent within the neck. Apparent moderate stenosis at the origin of this vessel. Flow related signal is seen intermittently within the non-dominant cervical right vertebral artery. MRA head: 1. 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 Reconstitution of the intracranial ICAs at the supraclinoid level. 2. Attenuated appearance of multiple proximal M2 left MCA branches. However, no left M2 proximal branch occlusion is identified. 3. Moderate stenosis within the A1 right ACA. Electronically Signed   By: Jackey LogeKyle  Golden DO   On:  11/14/2020 23:10   DG Chest Portable 1 View  Result Date: 11/14/2020 CLINICAL DATA:  Pt states he woke up at 10:00 and his arms felt shaky. He went back to bed and woke up at 1400. He felt unbalanced and couldn't get out of bed and kept falling to his R sided. He was  having a hard time with the coordination of his R arm and his L eye was blurry. Per [facility], they stated pt's hr went from 40 - 110. EXAM: PORTABLE CHEST 1 VIEW COMPARISON:  None. FINDINGS: Cardiac silhouette is normal in size. No mediastinal or hilar masses. There are bilateral coarsely thickened interstitial markings. Subtle hazy opacity also noted most evident in the right mid to lower lung. No convincing pleural effusion.  No pneumothorax. Skeletal structures are grossly intact. IMPRESSION: 1. Coarsely thickened bilateral interstitial markings with some hazy type opacity most evident in the right mid to lower lung. These findings may all be chronic. Consider multifocal infection particularly on the right, in the proper clinical setting. Electronically Signed   By: Amie Portland M.D.   On: 11/14/2020 17:04   CT HEAD CODE STROKE WO CONTRAST  Result Date: 11/14/2020 CLINICAL DATA:  Code stroke. Neuro deficit, acute, stroke suspected. Additional history provided: Left eye blurriness, right arm sensory and coordination. EXAM: CT HEAD WITHOUT CONTRAST TECHNIQUE: Contiguous axial images were obtained from the base of the skull through the vertex without intravenous contrast. COMPARISON:  No pertinent prior exams available for comparison. FINDINGS: Brain: Mild cerebral atrophy. Asymmetric extra-axial CSF density overlying the left cerebral hemisphere measuring up to 6 mm in thickness. This may be related to patient head positioning at the time of examination. A chronic subdural hematoma or subdural hygroma cannot be excluded. 4 mm rightward midline shift. Again, this may be related to patient head positioning or mass effect from a left subdural collection. Mild ill-defined hypoattenuation within the cerebral white matter, nonspecific, but compatible with chronic small vessel ischemic disease. There is no acute intracranial hemorrhage. No demarcated cortical infarct. No evidence of intracranial mass. No midline shift.  Vascular: No hyperdense vessel.  Atherosclerotic calcifications. Skull: Normal. Negative for fracture or focal lesion. Sinuses/Orbits: Visualized orbits show no acute finding. Moderate mucosal thickening within the right maxillary sinus with associated chronic reactive osteitis. Mild right frontal and bilateral ethmoid sinus mucosal thickening. ASPECTS [facility] Stroke Program Early CT Score) - Ganglionic level infarction (caudate, lentiform nuclei, internal capsule, insula, M1-M3 cortex): 7 - Supraganglionic infarction (M4-M6 cortex): 3 Total score (0-10 with 10 being normal): 10 These results were communicated to Dr. Otelia Limes At 4:31 pmon 2/13/2022by text page via the [facility] messaging system. IMPRESSION: No evidence of acute intracranial hemorrhage or acute infarct. ASPECTS is 10. Asymmetric extra-axial CSF density overlying the left cerebral hemisphere measuring up to 6 mm in thickness. This may be related to patient head positioning at the time of the examination. A chronic subdural hematoma or subdural hygroma cannot be excluded. 4 mm rightward midline shift. Again, this may be related to patient head positioning or mass effect from a left subdural collection. Mild cerebral atrophy and chronic small vessel ischemic disease. Paranasal sinus disease, most notably chronic right maxillary sinusitis. Electronically Signed   By: Jackey Loge DO   On: 11/14/2020 16:33        Scheduled Meds: . aspirin  325 mg Oral Daily   Continuous Infusions:   LOS: 0 days        Kathlen Mody, MD [facility]  To contact the attending provider between 7A-7P or the covering provider during after hours 7P-7A, please log into the web site [email or web address] and access using universal [place] password for that web site. If you do not have the password, please call the hospital operator.  11/15/2020, 9:01 AM

## 2020-11-15 NOTE — Progress Notes (Signed)
STROKE TEAM PROGRESS NOTE   INTERVAL HISTORY His significant other is at the bedside.  She feels patient has clinically worsened in the last couple of hours.  He appears to be more aphasic with increased right hemiparesis.  His blood pressure has stayed in the 150-160 systolic range.  CT angiogram as well as MR angiogram both demonstrate bilateral internal carotid artery occlusions with right vertebral artery occlusion as well.  Left vertebral artery is dominant.  He has preserved flow in both middle cerebral arteries through collaterals.  Patient has never been to a physician for last 25 years and is not aware if he has known carotid stenosis and denies prior history of TIAs and strokes.  Vitals:   11/15/20 0657 11/15/20 0715 11/15/20 0903 11/15/20 1012  BP:  (!) 161/73  (!) 175/64  Pulse:  (!) 58  (!) 39  Resp:  (!) 26  14  Temp: 98 F (36.7 C)  97.8 F (36.6 C)   TempSrc: Oral  Oral   SpO2:  98%  99%  Weight:      Height:       CBC:  Recent Labs  Lab 11/14/20 1610 11/14/20 1614 11/15/20 0414  WBC 8.4  --  9.4  NEUTROABS 5.0  --   --   HGB 15.2 16.0 12.9*  HCT 46.8 47.0 39.3  MCV 94.7  --  95.2  PLT 191  --  149*   Basic Metabolic Panel:  Recent Labs  Lab 11/14/20 1610 11/14/20 1614 11/15/20 0351  NA 140 140 139  K 4.1 4.0 4.4  CL 105 107 108  CO2 23  --  19*  GLUCOSE 123* 112* 98  BUN 20 23 18   CREATININE 1.63* 1.50* 1.43*  CALCIUM 9.3  --  9.1  MG  --   --  2.0   **Lipid Panel:  Recent Labs  Lab 11/15/20 0351  CHOL 156  TRIG 58  HDL 52  CHOLHDL 3.0  VLDL 12  LDLCALC 92   HgbA1c:  Recent Labs  Lab 11/15/20 0414  HGBA1C 4.8   Urine Drug Screen: Pending Alcohol Level No results for input(s): ETH in the last 168 hours.  IMAGING past 24 hours CT ANGIO HEAD W OR WO CONTRAST  Result Date: 11/15/2020 CLINICAL DATA:  Bilateral ICA occlusion show by MRI. Small left hemisphere infarctions. EXAM: CT ANGIOGRAPHY HEAD AND NECK TECHNIQUE:  IMPRESSION:  1.  Atherosclerotic disease of the aortic arch.  2. 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 Reconstituted flow in the left external carotid artery. No flow in the left cervical internal carotid artery.  3. 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 Severe stenosis of the right external carotid artery.  4. Occlusion of the right vertebral artery at its origin but reconstituted as a small vessel by cervical collaterals.  5. No intracranial large or medium vessel occlusion or correctable proximal stenosis.  6. Mediastinal adenopathy, including a necrotic appearing node at the lower right paratracheal region measuring 2.5 cm. Complete chest evaluation suggested to rule out malignancy.  7. Emphysema and scarring. Fluid in the major fissure on the right or some consolidation of the dorsal aspect of the inferior right upper lobe.  8. Emphysema and aortic atherosclerosis. Aortic Atherosclerosis (ICD10-I70.0) and Emphysema (ICD10-J43.9). Electronically Signed   By: 11/17/2020 M.D.   On: 11/15/2020 00:37    CT CHEST WO CONTRAST  Result Date: 11/15/2020 CLINICAL DATA:  Lung nodule. EXAM: CT  CHEST WITHOUT CONTRAST TECHNIQUE: IMPRESSION:  1. 7 mm nodule is noted in the left lung apex. Non-contrast chest CT at 6-12 months is recommended. If the nodule is stable at time of repeat CT, then future CT at 18-24 months (from today's scan) is considered optional for low-risk patients, but is recommended for high-risk patients. This recommendation follows the consensus statement: Guidelines for Management of Incidental Pulmonary Nodules Detected on CT Images: From the Fleischner Society 2017; Radiology 2017; 284:228-243.  2. Interstitial densities are noted throughout both lungs which may represent pulmonary edema, although atypical infection or scarring cannot be excluded.  3. Minimal bilateral pleural effusions are noted.  4. Enlarged mediastinal adenopathy is  noted which may be inflammatory or neoplastic in etiology. Clinical correlation is recommended.  5. Coronary artery calcifications are noted.  6. Cholelithiasis.  7. Nonobstructive right renal calculus.  8. Emphysema and aortic atherosclerosis. Aortic Atherosclerosis (ICD10-I70.0) and Emphysema (ICD10-J43.9). Electronically Signed   By: Lupita RaiderJames  Green Jr M.D.   On: 11/15/2020 09:55   MR ANGIO HEAD WO CONTRAST  Result Date: 11/14/2020 CLINICAL DATA:  Neuro deficit, acute, stroke suspected. EXAM: MR HEAD WITHOUT CONTRAST MR CIRCLE OF WILLIS WITHOUT CONTRAST MRA OF THE NECK WITHOUT CONTRAST  IMPRESSION: MRI brain:  1. Intermittently motion degraded examination.  2. Small acute infarcts within the left parietal cortex and left frontoparietal white matter  3. Background mild-to-moderate cerebral atrophy and mild chronic small vessel ischemic disease.  4. Tiny chronic right cerebellar infarct.  5. Redemonstrated asymmetric CSF intensity prominence overlying the left frontal lobe. This appears to reflect asymmetric prominence of the subarachnoid space rather than a subdural collection.   MRA neck:  1. The right ICA is poorly delineated shortly beyond its origin, likely occluded or near occluded.  2. The left CCA is nonvisualized except for its very proximal portion. The cervical left ICA is also nonvisualized. These vessels are likely occluded or near occluded.  3. The dominant left vertebral artery is patent within the neck. Apparent moderate stenosis at the origin of this vessel. Flow related signal is seen intermittently within the non-dominant cervical right vertebral artery.   MRA head:  1. 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 Reconstitution of the intracranial ICAs at the supraclinoid level.  2. Attenuated appearance of multiple proximal M2 left MCA branches. However, no left M2 proximal branch occlusion is identified.  3.  Moderate stenosis within the A1 right ACA. Electronically Signed   By: Jackey LogeKyle  Golden DO   On: 11/14/2020 23:10   DG Chest Portable 1 View  Result Date: 11/14/2020  IMPRESSION: 1. Coarsely thickened bilateral interstitial markings with some hazy type opacity most evident in the right mid to lower lung. These findings may all be chronic. Consider multifocal infection particularly on the right, in the proper clinical setting. Electronically Signed   By: Amie Portlandavid  Ormond M.D.   On: 11/14/2020 17:04   CT HEAD CODE STROKE WO CONTRAST  Result Date: 11/14/2020 CLINICAL DATA:  Code stroke.  IMPRESSION:  1. No evidence of acute intracranial hemorrhage or acute infarct. ASPECTS is 10. Asymmetric extra-axial CSF density overlying the left cerebral hemisphere measuring up to 6 mm in thickness. This may be related to patient head positioning at the time of the examination. A chronic subdural hematoma or subdural hygroma cannot be excluded. 4 mm rightward midline shift. Again, this may be related to patient head positioning or mass effect from a left subdural collection. Mild cerebral  atrophy and chronic small vessel ischemic disease. Paranasal sinus disease, most notably chronic right maxillary sinusitis. Electronically Signed   By: Jackey Loge DO   On: 11/14/2020 16:33   VAS US CAROTID (at [facility] and [facility] only)  Result Date: 11/15/2020 Summary:  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 The ECA appears >50% stenosed.  Left Carotid: Evidence consistent with near occlusion of the left ICA. The CCA appears occluded. The ECA appears <50% stenosed. Vertebrals:  Left vertebral artery demonstrates antegrade flow. Right vertebral artery was not visualized. Subclavians: Normal flow hemodynamics were seen in bilateral subclavian arteries.Electronically signed by Delia Heady MD on 11/15/2020 at 12:18:20 PM.    Final     PHYSICAL EXAM Elderly Caucasian male who is not in distress. . Afebrile.  Head is nontraumatic. Neck is supple without bruit.    Cardiac exam no murmur or gallop. Lungs are clear to auscultation. Distal pulses are well felt. Neurological Exam :  Patient is awake alert.  He has expressive aphasia and speak a few words and occasional short sentences.  He is able to follow simple midline commands only.  He has trouble with naming and repetition.  He has left gaze preference but can look to the right.  Blinks to threat more on the left than the right.  Right lower facial weakness.  Tongue midline.  Motor system exam shows right hemiparesis with 3/5 strength.  Good strength on the left. ASSESSMENT/PLAN Mr. Martin Collins is a 87 y.o. male with no known history (not been to the doctor for over 25 years) presenting with acute onset of left monocular visual blurring, BUE tremor, falling to the right while seated and right sided incoordination..    Stroke:  left small acute infarcts in the left parietal cortex and left frontoparietal white matter likely secondary to bilateral carotid artery occlusion which appears chronic with artery to artery embolism or failure of collaterals.  Code Stroke: CT head No acute abnormality. ASPECTS 10.    Not a tPA candidate as he was out of the time window  CTA head & neck:   left common carotid artery cclusion immediately beyond the origin. Reconstituted flow in the left external carotid artery. No flow in the left cervical internal carotid artery.   right ICA occlusion at the bifurcation. Severe stenosis of the right external carotid artery.   right vertebral artery occlusion at its origin but reconstituted as a small vessel by cervical collaterals.   No intracranial large vessel occlusion   MRI: Small acute infarcts within the left parietal cortex and left frontoparietal  MRA neck: right ICA occlusion, left CCA occlusion, and left ICA occlusion   MRA head:  intracranial internal carotid arteries remain occluded or near occluded throughout  the siphon region  Carotid Doppler: total occlusion of the right ICA and near occlusion of the left ICA  2D Echo Pending  LDL 92  HgbA1c 4.8  VTE prophylaxis -SCDs  No antithrombotic prior to admission, now on aspirin 325 mg daily. Change to Aspirin 81mg  and Plavix 75 for 3 months then continue with aspirin alone  Therapy recommendations:  Pending, OT: CIR  Disposition:  Pending  Right ICA, Left ICA occlusion, left CCA occlusion - MRA right ICA occlusion, left CCA occlusion, and left ICA occlusion  - On DAPT with ASA 81 mg + Plavix 75 m  Hypertension  Home meds:  none . Allow for permissive hypertension for cerbral perfusion . Long-term BP goal normotensive  Hyperlipidemia  Home meds:  none,   LDL 92, goal < 70  Add atorvastatin 80   Continue statin at discharge  Diabetes type II Controlled  Home meds: none  HgbA1c 4.8, goal < 7.0  CBGs Recent Labs    11/14/20 1608  GLUCAP 116*      SSI  Other Stroke Risk Factors  Advanced Age >/= 71   Cigarette smoker (former)  ETOH use, drinks 1 glass of wine per day advised to drink no more than 1-2 drink(s) a day Obesity, Body mass index is 31.44 kg/m., BMI >/= 30 associated with increased stroke risk, recommend weight loss, diet and exercise as appropriate   Other Active Problems  Mediastinal adenopathy inclining nodule in the lower right paratracheal region measuring 2.5  Will need outpatient follow-up  Troponenemia  Likely due to demand ischemia  Cardiology consulted  Plan for TTE with bubble  30 day outpatient monitor to assess for atrial fibrillation given his acute CVA and possible embolic source  Acute possible on Chronic kidney Disease  Creat level 1.43, unclear baseline  Follow level in am  Continue IVF  Hospital day # 0 Patient is presented with aphasia and mild right hemiparesis and appears to have an neurological worsening.  Unfortunately has what appears to be chronic bilateral  carotid and right vertebral artery occlusion and he is not a candidate for endovascular revascularization at present time.  Maintain permissive hypertension with systolic blood pressure 150 -160 range.  Check swallow eval and if patient is able to swallow aspirin 81 mg and Plavix 75 mg daily after Plavix load of 300 mg.  Continue ongoing stroke work-up.  Close neurological monitoring.  Will likely need inpatient rehab.  Greater than 50% time during this 35-minute visit was spent in counseling and coordination of care about his stroke and carotid disease and answering questions.  Discussed with Dr. Blake Divine. Delia Heady, MD  To contact Stroke Continuity provider, please refer to [email or web address]. After hours, contact General Neurology

## 2020-11-15 NOTE — Progress Notes (Signed)
OT Cancellation Note  Patient Details Name: Martin Collins MRN: [id number] DOB: 10/18/1933   Cancelled Treatment:    Reason Eval/Treat Not Completed: Patient at procedure or test/ unavailable (CT. Will return as schedule allows.)  [name] [name] [name] MSOT, OTR/L Acute Rehab Pager: [phone] Office: [phone] 11/15/2020, 9:47 AM

## 2020-11-15 NOTE — ED Notes (Signed)
Lab will add on troponin to light green tube sent down.

## 2020-11-15 NOTE — CV Procedure (Signed)
Echocardiogram not completed, patient is moving to another unit. Will re-attempt at another time today.  Leta Jungling

## 2020-11-15 NOTE — ED Notes (Signed)
Pt transported to MRI 

## 2020-11-15 NOTE — Progress Notes (Signed)
Carotid duplex bilateral study completed.   Please see CV Proc for preliminary results.   Maximillian Habibi, RDMS  

## 2020-11-15 NOTE — Progress Notes (Signed)
  Echocardiogram 2D Echocardiogram has been performed.  Martin Collins 11/15/2020, 2:41 PM

## 2020-11-15 NOTE — Progress Notes (Signed)
PT Cancellation Note  Patient Details Name: Martin Collins MRN: [id number] DOB: [birth year]   Cancelled Treatment:    Reason Eval/Treat Not Completed: Patient at procedure or test/unavailable.  Pt is moving to another unit per chart, will reattempt at another time.   Ivar Drape 11/15/2020, 11:41 AM  Samul Dada, PT MS Acute Rehab Dept. Number: [facility] [id number] and [facility] [phone]

## 2020-11-15 NOTE — Evaluation (Signed)
Occupational Therapy Evaluation Patient Details Name: Martin Collins MRN: [id number] DOB: [birth year] Today's Date: 11/15/2020    History of Present Illness 87 yo male presenting with blurry vision at left eye and right sided weakness. 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 PMH including GERD.    Clinical Impression   PTA, pt was living with his fiance and was independent; information provided by fiance who was at bedside. Pt currently requiring Max-Total A for ADLs and Max A for bed mobility. Pt requiring Max A to gain balance at EOB and using LUE to stabilize at footboard. Pt achieving sitting balance with UE support and Min A. Pt presenting with poor vision, cognition, balance, strength, and safety. Pt will require further acute OT to facilitate safe dc. Due to pt's high motivation, PLOF, and good home support, recommend dc to CIR for intensive OT to optimize safety, independence with ADLs, and return to PLOF.     Follow Up Recommendations  CIR    Equipment Recommendations  3 in 1 bedside commode    Recommendations for Other Services PT consult;Speech consult;Rehab consult     Precautions / Restrictions Precautions Precautions: Fall      Mobility Bed Mobility Overal bed mobility: Needs Assistance Bed Mobility: Supine to Sit;Sit to Supine     Supine to sit: Max assist;HOB elevated Sit to supine: Max assist   General bed mobility comments: Max A to bring BLEs EOB and then elevate trunk. Pt reaching for foot board to hold with LUE. Requiring Max A for elevating BLEs in return to supine.    Transfers                 General transfer comment: Defer for safety    Balance Overall balance assessment: Needs assistance Sitting-balance support: Single extremity supported;Feet supported Sitting balance-Leahy Scale: Poor Sitting balance - Comments: Using LUE at foot board to maintain balance with Min A for support.  Flucuating to Max A and lateral lean to R. Postural control: Right lateral lean;Posterior lean                                 ADL either performed or assessed with clinical judgement   ADL Overall ADL's : Needs assistance/impaired Eating/Feeding: NPO;Bed level   Grooming: Minimal assistance;Bed level   Upper Body Bathing: Maximal assistance;Sitting   Lower Body Bathing: Total assistance;Bed level   Upper Body Dressing : Maximal assistance;Sitting;Bed level   Lower Body Dressing: Total assistance;Bed level                 General ADL Comments: Pt presenting with poor cognition, balance, strength, and safety. Pt performing bed mobility to sit at EOB and then requiring Min-Max A to maintain sitting balance     Vision Baseline Vision/History: Wears glasses Wears Glasses: Reading only Patient Visual Report: Blurring of vision Vision Assessment?: Yes Eye Alignment: Within Functional Limits Tracking/Visual Pursuits: Unable to hold eye position out of midline Additional Comments: Poor visual attention. Tracking left and right from midline, but unable to hold outside of midline     Perception     Praxis      Pertinent Vitals/Pain Pain Assessment: No/denies pain     Hand Dominance Right   Extremity/Trunk Assessment Upper Extremity Assessment Upper Extremity Assessment: Generalized weakness;Difficult to assess due to impaired cognition   Lower Extremity Assessment Lower Extremity Assessment: Defer to PT evaluation  Cervical / Trunk Assessment Cervical / Trunk Assessment: Other exceptions Cervical / Trunk Exceptions: Poor trunk control with forward rounding of shoulders   Communication Communication Communication: Receptive difficulties;Other (comment) (maining replying yes/no)   Cognition Arousal/Alertness: Lethargic Behavior During Therapy: Restless Overall Cognitive Status: Difficult to assess Area of Impairment: Problem  solving;Awareness;Following commands;Attention                   Current Attention Level: Focused   Following Commands: Follows one step commands inconsistently   Awareness: Intellectual Problem Solving: Slow processing;Difficulty sequencing;Decreased initiation;Requires verbal cues;Requires tactile cues General Comments: Difficult to fully assess as pt with poor communication. Pt answering yes/no to questions inconsistently. Pt with focused attention to direct commands, but quickly loosing focus.   General Comments  HR 60-70. SpO2 90s on RA; moments dropping to 88% but pleth line poor and quickly returns to high 90s. RR 20s and then moments of elevating to 30-40s. BP 175/64 supine. Placed back on 2L at end of session.    Exercises     Shoulder Instructions      Home Living Family/patient expects to be discharged to:: Private residence Living Arrangements: Spouse/significant other Available Help at Discharge: Family;Available 24 hours/day Type of Home: House (Condo) Home Access: Stairs to enter [employer] of Steps: 4 Entrance Stairs-Rails: None Home Layout: Two level;Bed/bath upstairs;1/2 bath on main level Alternate Level Stairs-Number of Steps: 14 Alternate Level Stairs-Rails: Right (up the first seven, small landing, then no hand rail) Bathroom Shower/Tub: IT trainer: Standard     Home Equipment: None   Additional Comments: Fiance providing information on home set up      Prior Functioning/Environment Level of Independence: Independent        Comments: ADLs, IADLs, and Enjoys taking his dog for walks.        OT Problem List: Decreased strength;Decreased range of motion;Decreased activity tolerance;Impaired balance (sitting and/or standing);Decreased safety awareness;Decreased cognition;Decreased coordination;Impaired vision/perception;Decreased knowledge of use of DME or AE;Decreased knowledge of precautions       OT Treatment/Interventions: Self-care/ADL training;Therapeutic exercise;Energy conservation;DME and/or AE instruction;Therapeutic activities;Patient/family education    OT Goals(Current goals can be found in the care plan section) Acute Rehab OT Goals Patient Stated Goal: Pt agreeable to sit at EOB OT Goal Formulation: With patient/family Time For Goal Achievement: 11/29/20 Potential to Achieve Goals: Good  OT Frequency: Min 2X/week   Barriers to D/C:            Co-evaluation              AM-PAC OT "6 Clicks" Daily Activity     Outcome Measure Help from another person eating meals?: Total Help from another person taking care of personal grooming?: A Little Help from another person toileting, which includes using toliet, bedpan, or urinal?: Total Help from another person bathing (including washing, rinsing, drying)?: A Lot Help from another person to put on and taking off regular upper body clothing?: A Lot Help from another person to put on and taking off regular lower body clothing?: Total 6 Click Score: 10   End of Session Equipment Utilized During Treatment: Oxygen Nurse Communication: Mobility status  Activity Tolerance: Patient limited by fatigue Patient left: in bed;with call bell/phone within reach;with bed alarm set;with family/visitor present  OT Visit Diagnosis: Unsteadiness on feet (R26.81);Muscle weakness (generalized) (M62.81);Other abnormalities of gait and mobility (R26.89)                Time: 8299-3716 OT Time  Calculation (min): 32 min Charges:  OT General Charges $OT Visit: 1 Visit OT Evaluation $OT Eval Moderate Complexity: 1 Mod OT Treatments $Self Care/Home Management : 8-22 mins  Waldo Damian MSOT, OTR/L Acute Rehab Pager: [phone] Office: [phone]  Theodoro Grist Onyx Schirmer 11/15/2020, 12:21 PM

## 2020-11-15 NOTE — Progress Notes (Signed)
Progress Note  Patient Name: Martin Collins Date of Encounter: 11/15/2020  Primary Cardiologist: No primary care provider on file.   Subjective   Aphasic on my exam, history and current symptoms provided by fiancee of > 20 years at bedside Jonette Pesa and nurse.   Inpatient Medications    Scheduled Meds: . aspirin  325 mg Oral Daily   Continuous Infusions:  PRN Meds: acetaminophen **OR** acetaminophen   Vital Signs    Vitals:   11/15/20 0517 11/15/20 0600 11/15/20 0657 11/15/20 0715  BP: 130/70 124/82  (!) 161/73  Pulse: (!) 52 (!) 110  (!) 58  Resp: (!) 21 (!) 29  (!) 26  Temp:   98 F (36.7 C)   TempSrc:   Oral   SpO2: 93% 100%  98%  Weight:      Height:       No intake or output data in the 24 hours ending 11/15/20 0844 Filed Weights   11/14/20 1600 11/14/20 1641  Weight: 114.1 kg 114.1 kg    Telemetry     baseline artifact but appears sinus with PVCs. Tele suggests afib but P wave seen.- Personally Reviewed  ECG    SR, PVCs in bigeminy, LVH - Personally Reviewed  Physical Exam   GEN: calm but restless Neck: No JVD Cardiac: irregular rhythm, normal rate, no murmurs, rubs, or gallops.  Respiratory: Clear to auscultation bilaterally. GI: Soft, nontender, non-distended  MS: No edema; No deformity. Neuro:  aphasic, moving all extremities Psych: unable to assess  Labs    Chemistry Recent Labs  Lab 11/14/20 1610 11/14/20 1614 11/15/20 0351  NA 140 140 139  K 4.1 4.0 4.4  CL 105 107 108  CO2 23  --  19*  GLUCOSE 123* 112* 98  BUN 20 23 18   CREATININE 1.63* 1.50* 1.43*  CALCIUM 9.3  --  9.1  PROT 7.3  --   --   ALBUMIN 3.8  --   --   AST 31  --   --   ALT 14  --   --   ALKPHOS 78  --   --   BILITOT 1.2  --   --   GFRNONAA 41*  --  48*  ANIONGAP 12  --  12     Hematology Recent Labs  Lab 11/14/20 1610 11/14/20 1614 11/15/20 0414  WBC 8.4  --  9.4  RBC 4.94  --  4.13*  HGB 15.2 16.0 12.9*  HCT 46.8 47.0 39.3  MCV 94.7  --  95.2   MCH 30.8  --  31.2  MCHC 32.5  --  32.8  RDW 13.5  --  13.4  PLT 191  --  149*    Cardiac EnzymesNo results for input(s): TROPONINI in the last 168 hours. No results for input(s): TROPIPOC in the last 168 hours.   BNP Recent Labs  Lab 11/14/20 1618  BNP 480.2*     DDimer No results for input(s): DDIMER in the last 168 hours.   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 Small left hemisphere infarctions. EXAM: CT ANGIOGRAPHY HEAD AND NECK TECHNIQUE: Multidetector CT imaging of the head and neck was performed using the standard protocol during bolus administration of intravenous contrast. Multiplanar CT image reconstructions and MIPs were obtained to evaluate the vascular anatomy. Carotid stenosis measurements (when applicable) are obtained utilizing NASCET criteria, using the distal internal  carotid diameter as the denominator. CONTRAST:  60mL OMNIPAQUE IOHEXOL 350 MG/ML SOLN COMPARISON:  None. FINDINGS: CTA NECK FINDINGS Aortic arch: Aortic atherosclerosis.  Branching pattern is normal. Right carotid system: Pronounced atherosclerotic change of the innominate artery. Right common carotid artery shows intimal thickening but is patent to the bifurcation. Severe soft plaque at the bifurcation with occlusion of the ICA at its origin. Severe stenosis of the proximal right ECA. No reconstitution in the neck. Left carotid system: Common carotid artery occluded immediately beyond the origin. No flow seen beyond that to the region of the bifurcation. There is some reconstituted flow in the external carotid artery. No flow in the cervical internal carotid artery. Vertebral arteries: Left vertebral artery origin is widely patent. The left vertebral artery is patent through the cervical region to the foramen magnum. The right vertebral artery is occluded at its origin but reconstituted as a small vessel by cervical collaterals.  Some antegrade flow occurs to the level of the foramen magnum. Skeleton: Ordinary cervical spondylosis. Other neck: No neck mass or neck lymphadenopathy. Upper chest: Patient has mediastinal adenopathy in the Peri carinal region and right paratracheal region including a necrotic appearing node measuring 2.5 cm at the lower right paratracheal region. The lungs show emphysema and scarring. There is either fluid in the major fissure on the right or some consolidation of the dorsal aspect of the inferior right upper lobe. Complete chest evaluation suggested to rule out malignancy. Review of the MIP images confirms the above findings CTA HEAD FINDINGS Anterior circulation: No ICA flow at the skull base. Reconstitution in the distal siphon regions by external to internal collaterals. Supraclinoid internal carotid arteries are widely patent. Both anterior and middle cerebral vessels show flow. No large or medium vessel occlusion seen. Posterior circulation: Both vertebral arteries are patent through the foramen magnum to the basilar. There is some atherosclerotic disease of the vertebral artery V4 segments but no stenosis greater than 30%. Basilar artery shows mild atherosclerotic change but no flow limiting stenosis. Superior cerebellar and posterior cerebral arteries show flow. There is primary fetal origin of the right PCA. Venous sinuses: Patent and normal. Anatomic variants: None significant. Review of the MIP images confirms the above findings IMPRESSION: 1. Atherosclerotic disease of the aortic arch. 2. 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 Reconstituted flow in the left external carotid artery. No flow in the left cervical internal carotid artery. 3. 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 Severe stenosis of the right external carotid artery. 4. Occlusion of the right vertebral artery at its origin but reconstituted as a small vessel by cervical  collaterals. 5. No intracranial large or medium vessel occlusion or correctable proximal stenosis. 6. Mediastinal adenopathy, including a necrotic appearing node at the lower right paratracheal region measuring 2.5 cm. Complete chest evaluation suggested to rule out malignancy. 7. Emphysema and scarring. Fluid in the major fissure on the right or some consolidation of the dorsal aspect of the inferior right upper lobe. 8. Emphysema and aortic atherosclerosis. Aortic Atherosclerosis (ICD10-I70.0) and Emphysema (ICD10-J43.9). Electronically Signed   By: Paulina Fusi M.D.   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 Small left hemisphere infarctions. EXAM: CT ANGIOGRAPHY HEAD AND NECK TECHNIQUE: Multidetector CT imaging of the head and neck was performed using the standard protocol during bolus administration of intravenous contrast. Multiplanar CT image  reconstructions and MIPs were obtained to evaluate the vascular anatomy. Carotid stenosis measurements (when applicable) are obtained utilizing NASCET criteria, using the distal internal carotid diameter as the denominator. CONTRAST:  60mL OMNIPAQUE IOHEXOL 350 MG/ML SOLN COMPARISON:  None. FINDINGS: CTA NECK FINDINGS Aortic arch: Aortic atherosclerosis.  Branching pattern is normal. Right carotid system: Pronounced atherosclerotic change of the innominate artery. Right common carotid artery shows intimal thickening but is patent to the bifurcation. Severe soft plaque at the bifurcation with occlusion of the ICA at its origin. Severe stenosis of the proximal right ECA. No reconstitution in the neck. Left carotid system: Common carotid artery occluded immediately beyond the origin. No flow seen beyond that to the region of the bifurcation. There is some reconstituted flow in the external carotid artery. No flow in the cervical internal carotid artery. Vertebral arteries: Left  vertebral artery origin is widely patent. The left vertebral artery is patent through the cervical region to the foramen magnum. The right vertebral artery is occluded at its origin but reconstituted as a small vessel by cervical collaterals. Some antegrade flow occurs to the level of the foramen magnum. Skeleton: Ordinary cervical spondylosis. Other neck: No neck mass or neck lymphadenopathy. Upper chest: Patient has mediastinal adenopathy in the Peri carinal region and right paratracheal region including a necrotic appearing node measuring 2.5 cm at the lower right paratracheal region. The lungs show emphysema and scarring. There is either fluid in the major fissure on the right or some consolidation of the dorsal aspect of the inferior right upper lobe. Complete chest evaluation suggested to rule out malignancy. Review of the MIP images confirms the above findings CTA HEAD FINDINGS Anterior circulation: No ICA flow at the skull base. Reconstitution in the distal siphon regions by external to internal collaterals. Supraclinoid internal carotid arteries are widely patent. Both anterior and middle cerebral vessels show flow. No large or medium vessel occlusion seen. Posterior circulation: Both vertebral arteries are patent through the foramen magnum to the basilar. There is some atherosclerotic disease of the vertebral artery V4 segments but no stenosis greater than 30%. Basilar artery shows mild atherosclerotic change but no flow limiting stenosis. Superior cerebellar and posterior cerebral arteries show flow. There is primary fetal origin of the right PCA. Venous sinuses: Patent and normal. Anatomic variants: None significant. Review of the MIP images confirms the above findings IMPRESSION: 1. Atherosclerotic disease of the aortic arch. 2. 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 Reconstituted flow in the left external carotid artery. No flow in the left cervical internal carotid  artery. 3. 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 Severe stenosis of the right external carotid artery. 4. Occlusion of the right vertebral artery at its origin but reconstituted as a small vessel by cervical collaterals. 5. No intracranial large or medium vessel occlusion or correctable proximal stenosis. 6. Mediastinal adenopathy, including a necrotic appearing node at the lower right paratracheal region measuring 2.5 cm. Complete chest evaluation suggested to rule out malignancy. 7. Emphysema and scarring. Fluid in the major fissure on the right or some consolidation of the dorsal aspect of the inferior right upper lobe. 8. Emphysema and aortic atherosclerosis. Aortic Atherosclerosis (ICD10-I70.0) and Emphysema (ICD10-J43.9). Electronically Signed   By: Paulina FusiMark  Shogry M.D.   On: 11/15/2020 00:37   MR ANGIO HEAD WO CONTRAST  Result Date: 11/14/2020 CLINICAL DATA:  Neuro deficit, acute, stroke suspected. EXAM: MR HEAD WITHOUT CONTRAST MR CIRCLE OF WILLIS WITHOUT CONTRAST MRA OF  THE NECK WITHOUT CONTRAST TECHNIQUE: Multiplanar, multiecho pulse sequences of the brain, circle of willis and surrounding structures were obtained without intravenous contrast. Angiographic images of the neck were obtained using MRA technique without intravenous contrast. COMPARISON:  Noncontrast head CT performed earlier today 11/14/2020. FINDINGS: MR HEAD FINDINGS Brain: Intermittently motion degraded examination. Most notably, there is mild-to-moderate motion degradation of the coronal T2 weighted sequence. Mild-to-moderate cerebral atrophy. Small acute cortical infarct within the left parietal lobe (series 5, image 9). Additional small curvilinear acute infarct within the left parietal white matter (for instance as seen on series 5, image 85). There are a few additional punctate acute infarcts within the left frontal lobe white matter (for instance as seen on series 5, image 86). Mild multifocal T2/FLAIR  hyperintensity within the cerebral white matter and pons is nonspecific, but compatible with chronic small vessel ischemic disease. Tiny chronic infarct within the right cerebellar hemisphere. Redemonstrated asymmetric CSF intensity prominence overlying the left frontal lobe. However, this appears to reflect asymmetric prominence of the subarachnoid space rather than a subdural collection. Unchanged 4 mm rightward midline shift, indeterminate in etiology. No evidence of intracranial mass. No chronic intracranial blood products. 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 Otherwise reported below. Skull and upper cervical spine: No focal marrow lesion. Sinuses/Orbits: Visualized orbits show no acute finding. Moderate right maxillary sinus mucosal thickening with associated chronic reactive osteitis. Trace bilateral ethmoid sinus mucosal thickening. 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 Reconstitution of flow related signal within the supraclinoid internal carotid arteries bilaterally. The M1 middle cerebral arteries are patent. Attenuated appearance of multiple proximal left M2 MCA branches. However, no left M2 proximal branch occlusion is identified. The anterior cerebral arteries are patent. Moderate stenosis within the A1 right ACA. No intracranial aneurysm is identified. The intracranial vertebral arteries are patent. The basilar artery is patent. Possible short segment fenestration within the proximal basilar artery. The posterior cerebral arteries are patent. Fetal origin right posterior cerebral artery. A small left posterior communicating artery is present. MRA NECK FINDINGS Mild intermittent motion degradation. The right CCA is patent to the bifurcation without significant stenosis. The right ICA is poorly delineated shortly beyond its origin, likely occluded or near  occluded. The left CCA is poorly delineated shortly beyond its origin, likely occluded or near occluded. The left ICA is nonvisualized, likely occluded or near occluded. The dominant left vertebral artery is patent within the neck. Apparent moderate stenosis at the origin of this vessel. Flow related signal is seen intermittently within the non dominant cervical right vertebral artery. These results were called by telephone at the time of interpretation on 11/14/2020 at 11:06 pm to provider Dr. Wilford Corner, who verbally acknowledged these results. IMPRESSION: MRI brain: 1. Intermittently motion degraded examination. 2. Small acute infarcts within the left parietal cortex and left frontoparietal white matter, as described. 3. Background mild-to-moderate cerebral atrophy and mild chronic small vessel ischemic disease. 4. Tiny chronic right cerebellar infarct. 5. Redemonstrated asymmetric CSF intensity prominence overlying the left frontal lobe. This appears to reflect asymmetric prominence of the subarachnoid space rather than a subdural collection. MRA neck: 1. The right ICA is poorly delineated shortly beyond its origin, likely occluded or near occluded. 2. The left CCA is nonvisualized except for its very proximal portion. The cervical left ICA is also nonvisualized. These vessels are likely occluded or near occluded. 3. The dominant left vertebral artery  is patent within the neck. Apparent moderate stenosis at the origin of this vessel. Flow related signal is seen intermittently within the non-dominant cervical right vertebral artery. MRA head: 1. 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 Reconstitution of the intracranial ICAs at the supraclinoid level. 2. Attenuated appearance of multiple proximal M2 left MCA branches. However, no left M2 proximal branch occlusion is identified. 3. Moderate stenosis within the A1 right ACA. Electronically Signed   By: Jackey Loge DO   On: 11/14/2020 23:10   MR ANGIO NECK WO CONTRAST  Result Date: 11/14/2020 CLINICAL DATA:  Neuro deficit, acute, stroke suspected. EXAM: MR HEAD WITHOUT CONTRAST MR CIRCLE OF WILLIS WITHOUT CONTRAST MRA OF THE NECK WITHOUT CONTRAST TECHNIQUE: Multiplanar, multiecho pulse sequences of the brain, circle of willis and surrounding structures were obtained without intravenous contrast. Angiographic images of the neck were obtained using MRA technique without intravenous contrast. COMPARISON:  Noncontrast head CT performed earlier today 11/14/2020. FINDINGS: MR HEAD FINDINGS Brain: Intermittently motion degraded examination. Most notably, there is mild-to-moderate motion degradation of the coronal T2 weighted sequence. Mild-to-moderate cerebral atrophy. Small acute cortical infarct within the left parietal lobe (series 5, image 9). Additional small curvilinear acute infarct within the left parietal white matter (for instance as seen on series 5, image 85). There are a few additional punctate acute infarcts within the left frontal lobe white matter (for instance as seen on series 5, image 86). Mild multifocal T2/FLAIR hyperintensity within the cerebral white matter and pons is nonspecific, but compatible with chronic small vessel ischemic disease. Tiny chronic infarct within the right cerebellar hemisphere. Redemonstrated asymmetric CSF intensity prominence overlying the left frontal lobe. However, this appears to reflect asymmetric prominence of the subarachnoid space rather than a subdural collection. Unchanged 4 mm rightward midline shift, indeterminate in etiology. No evidence of intracranial mass. No chronic intracranial blood products. 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 Otherwise reported below. Skull and upper cervical spine: No focal marrow lesion. Sinuses/Orbits: Visualized orbits show no acute finding. Moderate right maxillary sinus mucosal  thickening with associated chronic reactive osteitis. Trace bilateral ethmoid sinus mucosal thickening. 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 Reconstitution of flow related signal within the supraclinoid internal carotid arteries bilaterally. The M1 middle cerebral arteries are patent. Attenuated appearance of multiple proximal left M2 MCA branches. However, no left M2 proximal branch occlusion is identified. The anterior cerebral arteries are patent. Moderate stenosis within the A1 right ACA. No intracranial aneurysm is identified. The intracranial vertebral arteries are patent. The basilar artery is patent. Possible short segment fenestration within the proximal basilar artery. The posterior cerebral arteries are patent. Fetal origin right posterior cerebral artery. A small left posterior communicating artery is present. MRA NECK FINDINGS Mild intermittent motion degradation. The right CCA is patent to the bifurcation without significant stenosis. The right ICA is poorly delineated shortly beyond its origin, likely occluded or near occluded. The left CCA is poorly delineated shortly beyond its origin, likely occluded or near occluded. The left ICA is nonvisualized, likely occluded or near occluded. The dominant left vertebral artery is patent within the neck. Apparent moderate stenosis at the origin of this vessel. Flow related signal is seen intermittently within the non dominant cervical right vertebral artery. These results were called by telephone at the time of interpretation on 11/14/2020 at 11:06 pm to provider  Dr. Wilford Corner, who verbally acknowledged these results. IMPRESSION: MRI brain: 1. Intermittently motion degraded examination. 2. Small acute infarcts within the left parietal cortex and left frontoparietal white matter, as described. 3. Background mild-to-moderate cerebral atrophy and mild chronic small vessel  ischemic disease. 4. Tiny chronic right cerebellar infarct. 5. Redemonstrated asymmetric CSF intensity prominence overlying the left frontal lobe. This appears to reflect asymmetric prominence of the subarachnoid space rather than a subdural collection. MRA neck: 1. The right ICA is poorly delineated shortly beyond its origin, likely occluded or near occluded. 2. The left CCA is nonvisualized except for its very proximal portion. The cervical left ICA is also nonvisualized. These vessels are likely occluded or near occluded. 3. The dominant left vertebral artery is patent within the neck. Apparent moderate stenosis at the origin of this vessel. Flow related signal is seen intermittently within the non-dominant cervical right vertebral artery. MRA head: 1. 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 Reconstitution of the intracranial ICAs at the supraclinoid level. 2. Attenuated appearance of multiple proximal M2 left MCA branches. However, no left M2 proximal branch occlusion is identified. 3. Moderate stenosis within the A1 right ACA. Electronically Signed   By: Jackey Loge DO   On: 11/14/2020 23:10   MR Brain Wo Contrast (neuro protocol)  Result Date: 11/14/2020 CLINICAL DATA:  Neuro deficit, acute, stroke suspected. EXAM: MR HEAD WITHOUT CONTRAST MR CIRCLE OF WILLIS WITHOUT CONTRAST MRA OF THE NECK WITHOUT CONTRAST TECHNIQUE: Multiplanar, multiecho pulse sequences of the brain, circle of willis and surrounding structures were obtained without intravenous contrast. Angiographic images of the neck were obtained using MRA technique without intravenous contrast. COMPARISON:  Noncontrast head CT performed earlier today 11/14/2020. FINDINGS: MR HEAD FINDINGS Brain: Intermittently motion degraded examination. Most notably, there is mild-to-moderate motion degradation of the coronal T2 weighted sequence. Mild-to-moderate cerebral atrophy. Small acute  cortical infarct within the left parietal lobe (series 5, image 9). Additional small curvilinear acute infarct within the left parietal white matter (for instance as seen on series 5, image 85). There are a few additional punctate acute infarcts within the left frontal lobe white matter (for instance as seen on series 5, image 86). Mild multifocal T2/FLAIR hyperintensity within the cerebral white matter and pons is nonspecific, but compatible with chronic small vessel ischemic disease. Tiny chronic infarct within the right cerebellar hemisphere. Redemonstrated asymmetric CSF intensity prominence overlying the left frontal lobe. However, this appears to reflect asymmetric prominence of the subarachnoid space rather than a subdural collection. Unchanged 4 mm rightward midline shift, indeterminate in etiology. No evidence of intracranial mass. No chronic intracranial blood products. 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 Otherwise reported below. Skull and upper cervical spine: No focal marrow lesion. Sinuses/Orbits: Visualized orbits show no acute finding. Moderate right maxillary sinus mucosal thickening with associated chronic reactive osteitis. Trace bilateral ethmoid sinus mucosal thickening. 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 Reconstitution of flow related signal within the supraclinoid internal carotid arteries bilaterally. The M1 middle cerebral arteries are patent. Attenuated appearance of multiple proximal left M2 MCA branches. However, no left M2 proximal branch occlusion is identified. The anterior cerebral arteries are patent. Moderate stenosis within the A1 right ACA. No intracranial aneurysm is identified. The intracranial vertebral arteries are patent. The basilar artery is patent. Possible short segment fenestration within the proximal basilar artery.  The  posterior cerebral arteries are patent. Fetal origin right posterior cerebral artery. A small left posterior communicating artery is present. MRA NECK FINDINGS Mild intermittent motion degradation. The right CCA is patent to the bifurcation without significant stenosis. The right ICA is poorly delineated shortly beyond its origin, likely occluded or near occluded. The left CCA is poorly delineated shortly beyond its origin, likely occluded or near occluded. The left ICA is nonvisualized, likely occluded or near occluded. The dominant left vertebral artery is patent within the neck. Apparent moderate stenosis at the origin of this vessel. Flow related signal is seen intermittently within the non dominant cervical right vertebral artery. These results were called by telephone at the time of interpretation on 11/14/2020 at 11:06 pm to provider Dr. Wilford Corner, who verbally acknowledged these results. IMPRESSION: MRI brain: 1. Intermittently motion degraded examination. 2. Small acute infarcts within the left parietal cortex and left frontoparietal white matter, as described. 3. Background mild-to-moderate cerebral atrophy and mild chronic small vessel ischemic disease. 4. Tiny chronic right cerebellar infarct. 5. Redemonstrated asymmetric CSF intensity prominence overlying the left frontal lobe. This appears to reflect asymmetric prominence of the subarachnoid space rather than a subdural collection. MRA neck: 1. The right ICA is poorly delineated shortly beyond its origin, likely occluded or near occluded. 2. The left CCA is nonvisualized except for its very proximal portion. The cervical left ICA is also nonvisualized. These vessels are likely occluded or near occluded. 3. The dominant left vertebral artery is patent within the neck. Apparent moderate stenosis at the origin of this vessel. Flow related signal is seen intermittently within the non-dominant cervical right vertebral artery. MRA head: 1. 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 Reconstitution of the intracranial ICAs at the supraclinoid level. 2. Attenuated appearance of multiple proximal M2 left MCA branches. However, no left M2 proximal branch occlusion is identified. 3. Moderate stenosis within the A1 right ACA. Electronically Signed   By: Jackey Loge DO   On: 11/14/2020 23:10   DG Chest Portable 1 View  Result Date: 11/14/2020 CLINICAL DATA:  Pt states he woke up at 10:00 and his arms felt shaky. He went back to bed and woke up at 1400. He felt unbalanced and couldn't get out of bed and kept falling to his R sided. He was having a hard time with the coordination of his R arm and his L eye was blurry. Per [facility], they stated pt's hr went from 40 - 110. EXAM: PORTABLE CHEST 1 VIEW COMPARISON:  None. FINDINGS: Cardiac silhouette is normal in size. No mediastinal or hilar masses. There are bilateral coarsely thickened interstitial markings. Subtle hazy opacity also noted most evident in the right mid to lower lung. No convincing pleural effusion.  No pneumothorax. Skeletal structures are grossly intact. IMPRESSION: 1. Coarsely thickened bilateral interstitial markings with some hazy type opacity most evident in the right mid to lower lung. These findings may all be chronic. Consider multifocal infection particularly on the right, in the proper clinical setting. Electronically Signed   By: Amie Portland M.D.   On: 11/14/2020 17:04   CT HEAD CODE STROKE WO CONTRAST  Result Date: 11/14/2020 CLINICAL DATA:  Code stroke. Neuro deficit, acute, stroke suspected. Additional history provided: Left eye blurriness, right arm sensory and coordination. EXAM: CT HEAD WITHOUT CONTRAST TECHNIQUE: Contiguous axial images were obtained from the base of the skull through the vertex without intravenous contrast. COMPARISON:  No pertinent prior exams available  for comparison. FINDINGS: Brain: Mild cerebral atrophy.  Asymmetric extra-axial CSF density overlying the left cerebral hemisphere measuring up to 6 mm in thickness. This may be related to patient head positioning at the time of examination. A chronic subdural hematoma or subdural hygroma cannot be excluded. 4 mm rightward midline shift. Again, this may be related to patient head positioning or mass effect from a left subdural collection. Mild ill-defined hypoattenuation within the cerebral white matter, nonspecific, but compatible with chronic small vessel ischemic disease. There is no acute intracranial hemorrhage. No demarcated cortical infarct. No evidence of intracranial mass. No midline shift. Vascular: No hyperdense vessel.  Atherosclerotic calcifications. Skull: Normal. Negative for fracture or focal lesion. Sinuses/Orbits: Visualized orbits show no acute finding. Moderate mucosal thickening within the right maxillary sinus with associated chronic reactive osteitis. Mild right frontal and bilateral ethmoid sinus mucosal thickening. ASPECTS [facility] Stroke Program Early CT Score) - Ganglionic level infarction (caudate, lentiform nuclei, internal capsule, insula, M1-M3 cortex): 7 - Supraganglionic infarction (M4-M6 cortex): 3 Total score (0-10 with 10 being normal): 10 These results were communicated to Dr. Otelia Limes At 4:31 pmon 2/13/2022by text page via the [facility] messaging system. IMPRESSION: No evidence of acute intracranial hemorrhage or acute infarct. ASPECTS is 10. Asymmetric extra-axial CSF density overlying the left cerebral hemisphere measuring up to 6 mm in thickness. This may be related to patient head positioning at the time of the examination. A chronic subdural hematoma or subdural hygroma cannot be excluded. 4 mm rightward midline shift. Again, this may be related to patient head positioning or mass effect from a left subdural collection. Mild cerebral atrophy and chronic small vessel ischemic disease. Paranasal sinus disease, most notably chronic right  maxillary sinusitis. Electronically Signed   By: Jackey Loge DO   On: 11/14/2020 16:33    Cardiac Studies   Echo pending  Patient Profile   Mr. Demasi is an 46M with no known medical hx who presents with new onset left ocular visual range however bilateral upper extremity tremor, falling in the right side coordination issues with his last known normal around 10:30 AM.  Cardiology is consulted for elevated troponins.   Assessment & Plan   Principal Problem:   Stroke [facility]) Active Problems:   Elevated troponin   Elevated serum creatinine   Acid reflux   Demand ischemia - agree with obtaining an echocardiogram to risk stratify. Troponin elevated but overall flat and now downtrending.  - Agree with at least ASA 81 mg daily, if neurology recommends it, DAPT would be reasonable from CV standpoint as well.  - there is a concerning lymph node in the pericarinal region, would image this further for possible malignancy, as this may guide further interventions from cardiovascular standpoint.  - pending echo can consider further noninvasive ischemic evaluation.  - per neurology, permissive hypertension.  - agree with statin.   Cardiology will follow. Discussed in detail with family at the bedside.   For questions or updates, please contact [facility] Please consult [email or web address] for contact info under        Signed, Parke Poisson, MD  11/15/2020, 8:44 AM

## 2020-11-15 NOTE — Consult Note (Incomplete)
Physical Medicine and Rehabilitation Consult Reason for Consult: Right side weakness and blurred vision Referring Physician: [facility]   HPI: Junior Huezo is a 87 y.o. right-handed male with unremarkable past medical history except tobacco abuse and GERD.  Per chart review patient lives with spouse.  Two-level home bed and bath upstairs.  Independent and active prior to admission.  Presented 11/14/2020 with blurred vision and right side weakness of acute onset.  Admission chemistries unremarkable except glucose 123, creatinine 1.63, ammonia level 26, troponin 292-300.  CT/MRI showed small acute infarcts within the left parietal cortex and left frontal parietal white matter.  Tiny chronic right cerebellar infarct.  MRA head and neck showed right ICA to be poorly delineated shortly ballon is origin likely occluded or nearly occluded.  The intracranial internal carotid arteries remained occluded or near occluded throughout the siphon region.  Reconstitution of the intracranial ICAs at the supraclinoid level.  Patient did not receive TPA.  CT angiogram head and neck occlusion of the left common carotid artery immediately beyond its origin.  Echocardiogram pending.  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  Currently maintained on aspirin and Plavix for CVA prophylaxis.  Subcutaneous Lovenox for DVT prophylaxis.  Cardiology service follow-up in regards to elevated troponin felt to be related to demand ischemia agree with continuing DAPT.  Currently on a regular diet.  Therapy evaluations completed due to patient's right side weakness recommendations of physical medicine rehab consult.   Review of Systems  Constitutional: Negative for fever.  HENT: Negative for hearing loss.   Eyes: Positive for blurred vision.  Respiratory: Negative for cough and shortness of breath.   Cardiovascular: Negative for chest pain, palpitations and leg swelling.  Gastrointestinal: Positive  for constipation. Negative for heartburn, nausea and vomiting.       GERD  Genitourinary: Negative for dysuria and hematuria.  Musculoskeletal: Positive for joint pain and myalgias.  Skin: Negative for rash.  Neurological: Positive for weakness.  All other systems reviewed and are negative.  Past Medical History:  Diagnosis Date  . Acid reflux    History reviewed. No pertinent surgical history. History reviewed. No pertinent family history. Social History:  reports that he quit smoking about 12 years ago. He has a 30.00 pack-year smoking history. He has never used smokeless tobacco. He reports current alcohol use of about 7.0 standard drinks of alcohol per week. He reports previous drug use. Allergies: No Known Allergies Medications Prior to Admission  Medication Sig Dispense Refill  . bismuth subsalicylate (PEPTO BISMOL) 262 MG chewable tablet Chew 262-524 mg by mouth as needed for indigestion.    . calcium carbonate (TUMS - DOSED IN MG ELEMENTAL CALCIUM) 500 MG chewable tablet Chew 1-2 tablets by mouth as needed for indigestion or heartburn.    . dextromethorphan-guaiFENesin (MUCINEX DM) 30-600 MG 12hr tablet Take 1 tablet by mouth 2 (two) times daily as needed for cough (and/or chest congestion).    Marland Kitchen ibuprofen (ADVIL) 200 MG tablet Take 400 mg by mouth every 6 (six) hours as needed for mild pain (or headaches).    . Multiple Vitamins-Minerals (CENTRUM SILVER ADULT 50+) TABS Take 1 tablet by mouth daily with breakfast.    . NEXIUM 24HR 20 MG capsule Take 20 mg by mouth daily as needed (for heartburn/reflux).      Home: Home Living Family/patient expects to be discharged to:: Private residence Living Arrangements: Spouse/significant other Available Help at Discharge: Family,Available 24 hours/day Type of  Home: House (Condo) Home Access: Stairs to enter Secretary/administrator of Steps: 4 Entrance Stairs-Rails: None Home Layout: Two level,Bed/bath upstairs,1/2 bath on main  level Alternate Level Stairs-Number of Steps: 14 Alternate Level Stairs-Rails: Right (up the first seven, small landing, then no hand rail) Bathroom Shower/Tub: Teacher, early years/pre: Standard Home Equipment: None Additional Comments: Fiance providing information on home set up  Functional History: Prior Function Level of Independence: Independent Comments: ADLs, IADLs, and Enjoys taking his dog for walks. Functional Status:  Mobility: Bed Mobility Overal bed mobility: Needs Assistance Bed Mobility: Supine to Sit,Sit to Supine Supine to sit: Max assist,HOB elevated Sit to supine: Max assist General bed mobility comments: Max A to bring BLEs EOB and then elevate trunk. Pt reaching for foot board to hold with LUE. Requiring Max A for elevating BLEs in return to supine. Transfers General transfer comment: Defer for safety      ADL: ADL Overall ADL's : Needs assistance/impaired Eating/Feeding: NPO,Bed level Grooming: Minimal assistance,Bed level Upper Body Bathing: Maximal assistance,Sitting Lower Body Bathing: Total assistance,Bed level Upper Body Dressing : Maximal assistance,Sitting,Bed level Lower Body Dressing: Total assistance,Bed level General ADL Comments: Pt presenting with poor cognition, balance, strength, and safety. Pt performing bed mobility to sit at EOB and then requiring Min-Max A to maintain sitting balance  Cognition: Cognition Overall Cognitive Status: Difficult to assess Orientation Level: Disoriented to person Cognition Arousal/Alertness: Lethargic Behavior During Therapy: Restless Overall Cognitive Status: Difficult to assess Area of Impairment: Problem solving,Awareness,Following commands,Attention Current Attention Level: Focused Following Commands: Follows one step commands inconsistently Awareness: Intellectual Problem Solving: Slow processing,Difficulty sequencing,Decreased initiation,Requires verbal cues,Requires tactile  cues General Comments: Difficult to fully assess as pt with poor communication. Pt answering yes/no to questions inconsistently. Pt with focused attention to direct commands, but quickly loosing focus. Difficult to assess due to: Impaired communication  Blood pressure (!) 175/64, pulse (!) 39, temperature 97.8 F (36.6 C), temperature source Oral, resp. rate 14, height 6\' 3"  (1.905 m), weight 114.1 kg, SpO2 99 %. Physical Exam Neurological:     Comments: Patient is alert in no acute distress.  Oriented x3 and follows commands.     Results for orders placed or performed during the hospital encounter of 11/14/20 (from the past 24 hour(s))  CBG monitoring, ED     Status: Abnormal   Collection Time: 11/14/20  4:08 PM  Result Value Ref Range   Glucose-Capillary 116 (H) 70 - 99 mg/dL  Protime-INR     Status: None   Collection Time: 11/14/20  4:10 PM  Result Value Ref Range   Prothrombin Time 13.8 11.4 - 15.2 seconds   INR 1.1 0.8 - 1.2  APTT     Status: Abnormal   Collection Time: 11/14/20  4:10 PM  Result Value Ref Range   aPTT 37 (H) 24 - 36 seconds  CBC     Status: None   Collection Time: 11/14/20  4:10 PM  Result Value Ref Range   WBC 8.4 4.0 - 10.5 K/uL   RBC 4.94 4.22 - 5.81 MIL/uL   Hemoglobin 15.2 13.0 - 17.0 g/dL   HCT 16.1 09.6 - 04.5 %   MCV 94.7 80.0 - 100.0 fL   MCH 30.8 26.0 - 34.0 pg   MCHC 32.5 30.0 - 36.0 g/dL   RDW 40.9 81.1 - 91.4 %   Platelets 191 150 - 400 K/uL   nRBC 0.0 0.0 - 0.2 %  Differential     Status: None   Collection  Time: 11/14/20  4:10 PM  Result Value Ref Range   Neutrophils Relative % 59 %   Neutro Abs 5.0 1.7 - 7.7 K/uL   Lymphocytes Relative 28 %   Lymphs Abs 2.4 0.7 - 4.0 K/uL   Monocytes Relative 9 %   Monocytes Absolute 0.7 0.1 - 1.0 K/uL   Eosinophils Relative 4 %   Eosinophils Absolute 0.3 0.0 - 0.5 K/uL   Basophils Relative 0 %   Basophils Absolute 0.0 0.0 - 0.1 K/uL   Immature Granulocytes 0 %   Abs Immature Granulocytes 0.02  0.00 - 0.07 K/uL  Comprehensive metabolic panel     Status: Abnormal   Collection Time: 11/14/20  4:10 PM  Result Value Ref Range   Sodium 140 135 - 145 mmol/L   Potassium 4.1 3.5 - 5.1 mmol/L   Chloride 105 98 - 111 mmol/L   CO2 23 22 - 32 mmol/L   Glucose, Bld 123 (H) 70 - 99 mg/dL   BUN 20 8 - 23 mg/dL   Creatinine, Ser 6.04 (H) 0.61 - 1.24 mg/dL   Calcium 9.3 8.9 - 54.0 mg/dL   Total Protein 7.3 6.5 - 8.1 g/dL   Albumin 3.8 3.5 - 5.0 g/dL   AST 31 15 - 41 U/L   ALT 14 0 - 44 U/L   Alkaline Phosphatase 78 38 - 126 U/L   Total Bilirubin 1.2 0.3 - 1.2 mg/dL   GFR, Estimated 41 (L) >60 mL/min   Anion gap 12 5 - 15  I-stat chem 8, ED     Status: Abnormal   Collection Time: 11/14/20  4:14 PM  Result Value Ref Range   Sodium 140 135 - 145 mmol/L   Potassium 4.0 3.5 - 5.1 mmol/L   Chloride 107 98 - 111 mmol/L   BUN 23 8 - 23 mg/dL   Creatinine, Ser 9.81 (H) 0.61 - 1.24 mg/dL   Glucose, Bld 191 (H) 70 - 99 mg/dL   Calcium, Ion 4.78 (L) 1.15 - 1.40 mmol/L   TCO2 21 (L) 22 - 32 mmol/L   Hemoglobin 16.0 13.0 - 17.0 g/dL   HCT 29.5 62.1 - 30.8 %  Brain natriuretic peptide     Status: Abnormal   Collection Time: 11/14/20  4:18 PM  Result Value Ref Range   B Natriuretic Peptide 480.2 (H) 0.0 - 100.0 pg/mL  Ammonia     Status: None   Collection Time: 11/14/20  5:01 PM  Result Value Ref Range   Ammonia 26 9 - 35 umol/L  Troponin I (High Sensitivity)     Status: Abnormal   Collection Time: 11/14/20  5:01 PM  Result Value Ref Range   Troponin I (High Sensitivity) 292 (HH) <18 ng/L  SARS CORONAVIRUS 2 (TAT 6-24 HRS) Nasopharyngeal Nasopharyngeal Swab     Status: None   Collection Time: 11/14/20  5:20 PM   Specimen: Nasopharyngeal Swab  Result Value Ref Range   SARS Coronavirus 2 NEGATIVE NEGATIVE  Troponin I (High Sensitivity)     Status: Abnormal   Collection Time: 11/14/20  7:00 PM  Result Value Ref Range   Troponin I (High Sensitivity) 300 (HH) <18 ng/L  Urinalysis, Routine w  reflex microscopic Urine, Clean Catch     Status: Abnormal   Collection Time: 11/14/20  9:38 PM  Result Value Ref Range   Color, Urine YELLOW YELLOW   APPearance CLEAR CLEAR   Specific Gravity, Urine >1.030 (H) 1.005 - 1.030   pH 5.5 5.0 -  8.0   Glucose, UA NEGATIVE NEGATIVE mg/dL   Hgb urine dipstick TRACE (A) NEGATIVE   Bilirubin Urine NEGATIVE NEGATIVE   Ketones, ur NEGATIVE NEGATIVE mg/dL   Protein, ur 161 (A) NEGATIVE mg/dL   Nitrite NEGATIVE NEGATIVE   Leukocytes,Ua NEGATIVE NEGATIVE  Sodium, urine, random     Status: None   Collection Time: 11/14/20  9:38 PM  Result Value Ref Range   Sodium, Ur 137 mmol/L  Creatinine, urine, random     Status: None   Collection Time: 11/14/20  9:38 PM  Result Value Ref Range   Creatinine, Urine 217.07 mg/dL  Urinalysis, Microscopic (reflex)     Status: None   Collection Time: 11/14/20  9:38 PM  Result Value Ref Range   RBC / HPF 0-5 0 - 5 RBC/hpf   WBC, UA 0-5 0 - 5 WBC/hpf   Bacteria, UA NONE SEEN NONE SEEN   Squamous Epithelial / LPF NONE SEEN 0 - 5  Lipid panel     Status: None   Collection Time: 11/15/20  3:51 AM  Result Value Ref Range   Cholesterol 156 0 - 200 mg/dL   Triglycerides 58 <096 mg/dL   HDL 52 >04 mg/dL   Total CHOL/HDL Ratio 3.0 RATIO   VLDL 12 0 - 40 mg/dL   LDL Cholesterol 92 0 - 99 mg/dL  Magnesium     Status: None   Collection Time: 11/15/20  3:51 AM  Result Value Ref Range   Magnesium 2.0 1.7 - 2.4 mg/dL  Basic metabolic panel     Status: Abnormal   Collection Time: 11/15/20  3:51 AM  Result Value Ref Range   Sodium 139 135 - 145 mmol/L   Potassium 4.4 3.5 - 5.1 mmol/L   Chloride 108 98 - 111 mmol/L   CO2 19 (L) 22 - 32 mmol/L   Glucose, Bld 98 70 - 99 mg/dL   BUN 18 8 - 23 mg/dL   Creatinine, Ser 5.40 (H) 0.61 - 1.24 mg/dL   Calcium 9.1 8.9 - 98.1 mg/dL   GFR, Estimated 48 (L) >60 mL/min   Anion gap 12 5 - 15  Troponin I (High Sensitivity)     Status: Abnormal   Collection Time: 11/15/20  3:51 AM   Result Value Ref Range   Troponin I (High Sensitivity) 248 (HH) <18 ng/L  Hemoglobin A1c     Status: None   Collection Time: 11/15/20  4:14 AM  Result Value Ref Range   Hgb A1c MFr Bld 4.8 4.8 - 5.6 %   Mean Plasma Glucose 91.06 mg/dL  CBC     Status: Abnormal   Collection Time: 11/15/20  4:14 AM  Result Value Ref Range   WBC 9.4 4.0 - 10.5 K/uL   RBC 4.13 (L) 4.22 - 5.81 MIL/uL   Hemoglobin 12.9 (L) 13.0 - 17.0 g/dL   HCT 19.1 47.8 - 29.5 %   MCV 95.2 80.0 - 100.0 fL   MCH 31.2 26.0 - 34.0 pg   MCHC 32.8 30.0 - 36.0 g/dL   RDW 62.1 30.8 - 65.7 %   Platelets 149 (L) 150 - 400 K/uL   nRBC 0.0 0.0 - 0.2 %   CT ANGIO HEAD W OR WO CONTRAST  Result Date: 11/15/2020 CLINICAL DATA:  Bilateral ICA occlusion show by MRI. Small left hemisphere infarctions. EXAM: CT ANGIOGRAPHY HEAD AND NECK TECHNIQUE: Multidetector CT imaging of the head and neck was performed using the standard protocol during bolus administration of intravenous contrast.  Multiplanar CT image reconstructions and MIPs were obtained to evaluate the vascular anatomy. Carotid stenosis measurements (when applicable) are obtained utilizing NASCET criteria, using the distal internal carotid diameter as the denominator. CONTRAST:  60mL OMNIPAQUE IOHEXOL 350 MG/ML SOLN COMPARISON:  None. FINDINGS: CTA NECK FINDINGS Aortic arch: Aortic atherosclerosis.  Branching pattern is normal. Right carotid system: Pronounced atherosclerotic change of the innominate artery. Right common carotid artery shows intimal thickening but is patent to the bifurcation. Severe soft plaque at the bifurcation with occlusion of the ICA at its origin. Severe stenosis of the proximal right ECA. No reconstitution in the neck. Left carotid system: Common carotid artery occluded immediately beyond the origin. No flow seen beyond that to the region of the bifurcation. There is some reconstituted flow in the external carotid artery. No flow in the cervical internal carotid  artery. Vertebral arteries: Left vertebral artery origin is widely patent. The left vertebral artery is patent through the cervical region to the foramen magnum. The right vertebral artery is occluded at its origin but reconstituted as a small vessel by cervical collaterals. Some antegrade flow occurs to the level of the foramen magnum. Skeleton: Ordinary cervical spondylosis. Other neck: No neck mass or neck lymphadenopathy. Upper chest: Patient has mediastinal adenopathy in the Peri carinal region and right paratracheal region including a necrotic appearing node measuring 2.5 cm at the lower right paratracheal region. The lungs show emphysema and scarring. There is either fluid in the major fissure on the right or some consolidation of the dorsal aspect of the inferior right upper lobe. Complete chest evaluation suggested to rule out malignancy. Review of the MIP images confirms the above findings CTA HEAD FINDINGS Anterior circulation: No ICA flow at the skull base. Reconstitution in the distal siphon regions by external to internal collaterals. Supraclinoid internal carotid arteries are widely patent. Both anterior and middle cerebral vessels show flow. No large or medium vessel occlusion seen. Posterior circulation: Both vertebral arteries are patent through the foramen magnum to the basilar. There is some atherosclerotic disease of the vertebral artery V4 segments but no stenosis greater than 30%. Basilar artery shows mild atherosclerotic change but no flow limiting stenosis. Superior cerebellar and posterior cerebral arteries show flow. There is primary fetal origin of the right PCA. Venous sinuses: Patent and normal. Anatomic variants: None significant. Review of the MIP images confirms the above findings IMPRESSION: 1. Atherosclerotic disease of the aortic arch. 2. 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 Reconstituted flow in the left external carotid artery. No flow in  the left cervical internal carotid artery. 3. 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 Severe stenosis of the right external carotid artery. 4. Occlusion of the right vertebral artery at its origin but reconstituted as a small vessel by cervical collaterals. 5. No intracranial large or medium vessel occlusion or correctable proximal stenosis. 6. Mediastinal adenopathy, including a necrotic appearing node at the lower right paratracheal region measuring 2.5 cm. Complete chest evaluation suggested to rule out malignancy. 7. Emphysema and scarring. Fluid in the major fissure on the right or some consolidation of the dorsal aspect of the inferior right upper lobe. 8. Emphysema and aortic atherosclerosis. Aortic Atherosclerosis (ICD10-I70.0) and Emphysema (ICD10-J43.9). Electronically Signed   By: Paulina Fusi M.D.   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 Small left hemisphere infarctions. EXAM: CT ANGIOGRAPHY  HEAD AND NECK TECHNIQUE: Multidetector CT imaging of the head and neck was performed using the standard protocol during bolus administration of intravenous contrast. Multiplanar CT image reconstructions and MIPs were obtained to evaluate the vascular anatomy. Carotid stenosis measurements (when applicable) are obtained utilizing NASCET criteria, using the distal internal carotid diameter as the denominator. CONTRAST:  60mL OMNIPAQUE IOHEXOL 350 MG/ML SOLN COMPARISON:  None. FINDINGS: CTA NECK FINDINGS Aortic arch: Aortic atherosclerosis.  Branching pattern is normal. Right carotid system: Pronounced atherosclerotic change of the innominate artery. Right common carotid artery shows intimal thickening but is patent to the bifurcation. Severe soft plaque at the bifurcation with occlusion of the ICA at its origin. Severe stenosis of the proximal right ECA. No reconstitution in the neck. Left  carotid system: Common carotid artery occluded immediately beyond the origin. No flow seen beyond that to the region of the bifurcation. There is some reconstituted flow in the external carotid artery. No flow in the cervical internal carotid artery. Vertebral arteries: Left vertebral artery origin is widely patent. The left vertebral artery is patent through the cervical region to the foramen magnum. The right vertebral artery is occluded at its origin but reconstituted as a small vessel by cervical collaterals. Some antegrade flow occurs to the level of the foramen magnum. Skeleton: Ordinary cervical spondylosis. Other neck: No neck mass or neck lymphadenopathy. Upper chest: Patient has mediastinal adenopathy in the Peri carinal region and right paratracheal region including a necrotic appearing node measuring 2.5 cm at the lower right paratracheal region. The lungs show emphysema and scarring. There is either fluid in the major fissure on the right or some consolidation of the dorsal aspect of the inferior right upper lobe. Complete chest evaluation suggested to rule out malignancy. Review of the MIP images confirms the above findings CTA HEAD FINDINGS Anterior circulation: No ICA flow at the skull base. Reconstitution in the distal siphon regions by external to internal collaterals. Supraclinoid internal carotid arteries are widely patent. Both anterior and middle cerebral vessels show flow. No large or medium vessel occlusion seen. Posterior circulation: Both vertebral arteries are patent through the foramen magnum to the basilar. There is some atherosclerotic disease of the vertebral artery V4 segments but no stenosis greater than 30%. Basilar artery shows mild atherosclerotic change but no flow limiting stenosis. Superior cerebellar and posterior cerebral arteries show flow. There is primary fetal origin of the right PCA. Venous sinuses: Patent and normal. Anatomic variants: None significant. Review of the  MIP images confirms the above findings IMPRESSION: 1. Atherosclerotic disease of the aortic arch. 2. 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 Reconstituted flow in the left external carotid artery. No flow in the left cervical internal carotid artery. 3. 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 Severe stenosis of the right external carotid artery. 4. Occlusion of the right vertebral artery at its origin but reconstituted as a small vessel by cervical collaterals. 5. No intracranial large or medium vessel occlusion or correctable proximal stenosis. 6. Mediastinal adenopathy, including a necrotic appearing node at the lower right paratracheal region measuring 2.5 cm. Complete chest evaluation suggested to rule out malignancy. 7. Emphysema and scarring. Fluid in the major fissure on the right or some consolidation of the dorsal aspect of the inferior right upper lobe. 8. Emphysema and aortic atherosclerosis. Aortic Atherosclerosis (ICD10-I70.0) and Emphysema (ICD10-J43.9). Electronically Signed   By: Paulina Fusi M.D.   On: 11/15/2020 00:37   CT CHEST WO  CONTRAST  Result Date: 11/15/2020 CLINICAL DATA:  Lung nodule. EXAM: CT CHEST WITHOUT CONTRAST TECHNIQUE: Multidetector CT imaging of the chest was performed following the standard protocol without IV contrast. COMPARISON:  November 14, 2020. FINDINGS: Cardiovascular: Atherosclerosis of thoracic aorta is noted without aneurysm formation. Mild cardiomegaly is noted. No pericardial effusion is noted. Coronary artery calcifications are noted. Mediastinum/Nodes: Thyroid gland and esophagus are unremarkable. 1.9 cm right paratracheal lymph node is noted. 2 cm precarinal lymph node is noted. 12 mm AP window lymph node is noted. Lungs/Pleura: No pneumothorax is noted. Minimal pleural effusions are noted. Emphysematous disease is noted in both upper lobes. Interstitial densities are noted throughout both lungs  which may represent pulmonary edema, although atypical infection or scarring cannot be excluded. 7 mm nodule is noted in the left lung apex best seen on image number 32 of series 7. Upper Abdomen: Cholelithiasis is noted. Nonobstructive right renal calculus is noted. Musculoskeletal: No chest wall mass or suspicious bone lesions identified. IMPRESSION: 1. 7 mm nodule is noted in the left lung apex. Non-contrast chest CT at 6-12 months is recommended. If the nodule is stable at time of repeat CT, then future CT at 18-24 months (from today's scan) is considered optional for low-risk patients, but is recommended for high-risk patients. This recommendation follows the consensus statement: Guidelines for Management of Incidental Pulmonary Nodules Detected on CT Images: From the Fleischner Society 2017; Radiology 2017; 284:228-243. 2. Interstitial densities are noted throughout both lungs which may represent pulmonary edema, although atypical infection or scarring cannot be excluded. 3. Minimal bilateral pleural effusions are noted. 4. Enlarged mediastinal adenopathy is noted which may be inflammatory or neoplastic in etiology. Clinical correlation is recommended. 5. Coronary artery calcifications are noted. 6. Cholelithiasis. 7. Nonobstructive right renal calculus. 8. Emphysema and aortic atherosclerosis. Aortic Atherosclerosis (ICD10-I70.0) and Emphysema (ICD10-J43.9). Electronically Signed   By: Lupita Raider M.D.   On: 11/15/2020 09:55   MR ANGIO HEAD WO CONTRAST  Result Date: 11/14/2020 CLINICAL DATA:  Neuro deficit, acute, stroke suspected. EXAM: MR HEAD WITHOUT CONTRAST MR CIRCLE OF WILLIS WITHOUT CONTRAST MRA OF THE NECK WITHOUT CONTRAST TECHNIQUE: Multiplanar, multiecho pulse sequences of the brain, circle of willis and surrounding structures were obtained without intravenous contrast. Angiographic images of the neck were obtained using MRA technique without intravenous contrast. COMPARISON:  Noncontrast  head CT performed earlier today 11/14/2020. FINDINGS: MR HEAD FINDINGS Brain: Intermittently motion degraded examination. Most notably, there is mild-to-moderate motion degradation of the coronal T2 weighted sequence. Mild-to-moderate cerebral atrophy. Small acute cortical infarct within the left parietal lobe (series 5, image 9). Additional small curvilinear acute infarct within the left parietal white matter (for instance as seen on series 5, image 85). There are a few additional punctate acute infarcts within the left frontal lobe white matter (for instance as seen on series 5, image 86). Mild multifocal T2/FLAIR hyperintensity within the cerebral white matter and pons is nonspecific, but compatible with chronic small vessel ischemic disease. Tiny chronic infarct within the right cerebellar hemisphere. Redemonstrated asymmetric CSF intensity prominence overlying the left frontal lobe. However, this appears to reflect asymmetric prominence of the subarachnoid space rather than a subdural collection. Unchanged 4 mm rightward midline shift, indeterminate in etiology. No evidence of intracranial mass. No chronic intracranial blood products. 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 Otherwise reported below. Skull and upper cervical spine: No focal marrow lesion. Sinuses/Orbits: Visualized orbits show no acute finding. Moderate right maxillary sinus  mucosal thickening with associated chronic reactive osteitis. Trace bilateral ethmoid sinus mucosal thickening. 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 Reconstitution of flow related signal within the supraclinoid internal carotid arteries bilaterally. The M1 middle cerebral arteries are patent. Attenuated appearance of multiple proximal left M2 MCA branches. However, no left M2 proximal branch occlusion is identified. The anterior  cerebral arteries are patent. Moderate stenosis within the A1 right ACA. No intracranial aneurysm is identified. The intracranial vertebral arteries are patent. The basilar artery is patent. Possible short segment fenestration within the proximal basilar artery. The posterior cerebral arteries are patent. Fetal origin right posterior cerebral artery. A small left posterior communicating artery is present. MRA NECK FINDINGS Mild intermittent motion degradation. The right CCA is patent to the bifurcation without significant stenosis. The right ICA is poorly delineated shortly beyond its origin, likely occluded or near occluded. The left CCA is poorly delineated shortly beyond its origin, likely occluded or near occluded. The left ICA is nonvisualized, likely occluded or near occluded. The dominant left vertebral artery is patent within the neck. Apparent moderate stenosis at the origin of this vessel. Flow related signal is seen intermittently within the non dominant cervical right vertebral artery. These results were called by telephone at the time of interpretation on 11/14/2020 at 11:06 pm to provider Dr. Wilford Corner, who verbally acknowledged these results. IMPRESSION: MRI brain: 1. Intermittently motion degraded examination. 2. Small acute infarcts within the left parietal cortex and left frontoparietal white matter, as described. 3. Background mild-to-moderate cerebral atrophy and mild chronic small vessel ischemic disease. 4. Tiny chronic right cerebellar infarct. 5. Redemonstrated asymmetric CSF intensity prominence overlying the left frontal lobe. This appears to reflect asymmetric prominence of the subarachnoid space rather than a subdural collection. MRA neck: 1. The right ICA is poorly delineated shortly beyond its origin, likely occluded or near occluded. 2. The left CCA is nonvisualized except for its very proximal portion. The cervical left ICA is also nonvisualized. These vessels are likely occluded or near  occluded. 3. The dominant left vertebral artery is patent within the neck. Apparent moderate stenosis at the origin of this vessel. Flow related signal is seen intermittently within the non-dominant cervical right vertebral artery. MRA head: 1. 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 Reconstitution of the intracranial ICAs at the supraclinoid level. 2. Attenuated appearance of multiple proximal M2 left MCA branches. However, no left M2 proximal branch occlusion is identified. 3. Moderate stenosis within the A1 right ACA. Electronically Signed   By: Jackey Loge DO   On: 11/14/2020 23:10   MR ANGIO NECK WO CONTRAST  Result Date: 11/14/2020 CLINICAL DATA:  Neuro deficit, acute, stroke suspected. EXAM: MR HEAD WITHOUT CONTRAST MR CIRCLE OF WILLIS WITHOUT CONTRAST MRA OF THE NECK WITHOUT CONTRAST TECHNIQUE: Multiplanar, multiecho pulse sequences of the brain, circle of willis and surrounding structures were obtained without intravenous contrast. Angiographic images of the neck were obtained using MRA technique without intravenous contrast. COMPARISON:  Noncontrast head CT performed earlier today 11/14/2020. FINDINGS: MR HEAD FINDINGS Brain: Intermittently motion degraded examination. Most notably, there is mild-to-moderate motion degradation of the coronal T2 weighted sequence. Mild-to-moderate cerebral atrophy. Small acute cortical infarct within the left parietal lobe (series 5, image 9). Additional small curvilinear acute infarct within the left parietal white matter (for instance as seen on series 5, image 85). There are a few additional punctate acute  infarcts within the left frontal lobe white matter (for instance as seen on series 5, image 86). Mild multifocal T2/FLAIR hyperintensity within the cerebral white matter and pons is nonspecific, but compatible with chronic small vessel ischemic disease. Tiny chronic infarct within the right  cerebellar hemisphere. Redemonstrated asymmetric CSF intensity prominence overlying the left frontal lobe. However, this appears to reflect asymmetric prominence of the subarachnoid space rather than a subdural collection. Unchanged 4 mm rightward midline shift, indeterminate in etiology. No evidence of intracranial mass. No chronic intracranial blood products. 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 Otherwise reported below. Skull and upper cervical spine: No focal marrow lesion. Sinuses/Orbits: Visualized orbits show no acute finding. Moderate right maxillary sinus mucosal thickening with associated chronic reactive osteitis. Trace bilateral ethmoid sinus mucosal thickening. 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 Reconstitution of flow related signal within the supraclinoid internal carotid arteries bilaterally. The M1 middle cerebral arteries are patent. Attenuated appearance of multiple proximal left M2 MCA branches. However, no left M2 proximal branch occlusion is identified. The anterior cerebral arteries are patent. Moderate stenosis within the A1 right ACA. No intracranial aneurysm is identified. The intracranial vertebral arteries are patent. The basilar artery is patent. Possible short segment fenestration within the proximal basilar artery. The posterior cerebral arteries are patent. Fetal origin right posterior cerebral artery. A small left posterior communicating artery is present. MRA NECK FINDINGS Mild intermittent motion degradation. The right CCA is patent to the bifurcation without significant stenosis. The right ICA is poorly delineated shortly beyond its origin, likely occluded or near occluded. The left CCA is poorly delineated shortly beyond its origin, likely occluded or near occluded. The left ICA is nonvisualized, likely occluded or near occluded.  The dominant left vertebral artery is patent within the neck. Apparent moderate stenosis at the origin of this vessel. Flow related signal is seen intermittently within the non dominant cervical right vertebral artery. These results were called by telephone at the time of interpretation on 11/14/2020 at 11:06 pm to provider Dr. Wilford Corner, who verbally acknowledged these results. IMPRESSION: MRI brain: 1. Intermittently motion degraded examination. 2. Small acute infarcts within the left parietal cortex and left frontoparietal white matter, as described. 3. Background mild-to-moderate cerebral atrophy and mild chronic small vessel ischemic disease. 4. Tiny chronic right cerebellar infarct. 5. Redemonstrated asymmetric CSF intensity prominence overlying the left frontal lobe. This appears to reflect asymmetric prominence of the subarachnoid space rather than a subdural collection. MRA neck: 1. The right ICA is poorly delineated shortly beyond its origin, likely occluded or near occluded. 2. The left CCA is nonvisualized except for its very proximal portion. The cervical left ICA is also nonvisualized. These vessels are likely occluded or near occluded. 3. The dominant left vertebral artery is patent within the neck. Apparent moderate stenosis at the origin of this vessel. Flow related signal is seen intermittently within the non-dominant cervical right vertebral artery. MRA head: 1. 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 Reconstitution of the intracranial ICAs at the supraclinoid level. 2. Attenuated appearance of multiple proximal M2 left MCA branches. However, no left M2 proximal branch occlusion is identified. 3. Moderate stenosis within the A1 right ACA. Electronically Signed   By: Jackey Loge DO   On: 11/14/2020 23:10   MR Brain Wo Contrast (neuro protocol)  Result Date: 11/14/2020 CLINICAL DATA:  Neuro  deficit, acute, stroke suspected. EXAM: MR  HEAD WITHOUT CONTRAST MR CIRCLE OF WILLIS WITHOUT CONTRAST MRA OF THE NECK WITHOUT CONTRAST TECHNIQUE: Multiplanar, multiecho pulse sequences of the brain, circle of willis and surrounding structures were obtained without intravenous contrast. Angiographic images of the neck were obtained using MRA technique without intravenous contrast. COMPARISON:  Noncontrast head CT performed earlier today 11/14/2020. FINDINGS: MR HEAD FINDINGS Brain: Intermittently motion degraded examination. Most notably, there is mild-to-moderate motion degradation of the coronal T2 weighted sequence. Mild-to-moderate cerebral atrophy. Small acute cortical infarct within the left parietal lobe (series 5, image 9). Additional small curvilinear acute infarct within the left parietal white matter (for instance as seen on series 5, image 85). There are a few additional punctate acute infarcts within the left frontal lobe white matter (for instance as seen on series 5, image 86). Mild multifocal T2/FLAIR hyperintensity within the cerebral white matter and pons is nonspecific, but compatible with chronic small vessel ischemic disease. Tiny chronic infarct within the right cerebellar hemisphere. Redemonstrated asymmetric CSF intensity prominence overlying the left frontal lobe. However, this appears to reflect asymmetric prominence of the subarachnoid space rather than a subdural collection. Unchanged 4 mm rightward midline shift, indeterminate in etiology. No evidence of intracranial mass. No chronic intracranial blood products. 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 Otherwise reported below. Skull and upper cervical spine: No focal marrow lesion. Sinuses/Orbits: Visualized orbits show no acute finding. Moderate right maxillary sinus mucosal thickening with associated chronic reactive osteitis. Trace bilateral ethmoid sinus mucosal thickening. MR CIRCLE OF WILLIS FINDINGS The intracranial  internal carotid arteries remain occluded or near occluded throughout the siphon region. Reconstitution of flow related signal within the supraclinoid internal carotid arteries bilaterally. The M1 middle cerebral arteries are patent. Attenuated appearance of multiple proximal left M2 MCA branches. However, no left M2 proximal branch occlusion is identified. The anterior cerebral arteries are patent. Moderate stenosis within the A1 right ACA. No intracranial aneurysm is identified. The intracranial vertebral arteries are patent. The basilar artery is patent. Possible short segment fenestration within the proximal basilar artery. The posterior cerebral arteries are patent. Fetal origin right posterior cerebral artery. A small left posterior communicating artery is present. MRA NECK FINDINGS Mild intermittent motion degradation. The right CCA is patent to the bifurcation without significant stenosis. The right ICA is poorly delineated shortly beyond its origin, likely occluded or near occluded. The left CCA is poorly delineated shortly beyond its origin, likely occluded or near occluded. The left ICA is nonvisualized, likely occluded or near occluded. The dominant left vertebral artery is patent within the neck. Apparent moderate stenosis at the origin of this vessel. Flow related signal is seen intermittently within the non dominant cervical right vertebral artery. These results were called by telephone at the time of interpretation on 11/14/2020 at 11:06 pm to provider Dr. Wilford CornerArora, who verbally acknowledged these results. IMPRESSION: MRI brain: 1. Intermittently motion degraded examination. 2. Small acute infarcts within the left parietal cortex and left frontoparietal white matter, as described. 3. Background mild-to-moderate cerebral atrophy and mild chronic small vessel ischemic disease. 4. Tiny chronic right cerebellar infarct. 5. Redemonstrated asymmetric CSF intensity prominence overlying the left frontal lobe.  This appears to reflect asymmetric prominence of the subarachnoid space rather than a subdural collection. MRA neck: 1. The right ICA is poorly delineated shortly beyond its origin, likely occluded or near occluded. 2. The left CCA is nonvisualized except for its very proximal portion. The cervical left  ICA is also nonvisualized. These vessels are likely occluded or near occluded. 3. The dominant left vertebral artery is patent within the neck. Apparent moderate stenosis at the origin of this vessel. Flow related signal is seen intermittently within the non-dominant cervical right vertebral artery. MRA head: 1. 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 Reconstitution of the intracranial ICAs at the supraclinoid level. 2. Attenuated appearance of multiple proximal M2 left MCA branches. However, no left M2 proximal branch occlusion is identified. 3. Moderate stenosis within the A1 right ACA. Electronically Signed   By: Jackey Loge DO   On: 11/14/2020 23:10   DG Chest Portable 1 View  Result Date: 11/14/2020 CLINICAL DATA:  Pt states he woke up at 10:00 and his arms felt shaky. He went back to bed and woke up at 1400. He felt unbalanced and couldn't get out of bed and kept falling to his R sided. He was having a hard time with the coordination of his R arm and his L eye was blurry. Per [facility], they stated pt's hr went from 40 - 110. EXAM: PORTABLE CHEST 1 VIEW COMPARISON:  None. FINDINGS: Cardiac silhouette is normal in size. No mediastinal or hilar masses. There are bilateral coarsely thickened interstitial markings. Subtle hazy opacity also noted most evident in the right mid to lower lung. No convincing pleural effusion.  No pneumothorax. Skeletal structures are grossly intact. IMPRESSION: 1. Coarsely thickened bilateral interstitial markings with some hazy type opacity most evident in the right mid to lower lung. These findings may all be chronic.  Consider multifocal infection particularly on the right, in the proper clinical setting. Electronically Signed   By: Amie Portland M.D.   On: 11/14/2020 17:04   CT HEAD CODE STROKE WO CONTRAST  Result Date: 11/14/2020 CLINICAL DATA:  Code stroke. Neuro deficit, acute, stroke suspected. Additional history provided: Left eye blurriness, right arm sensory and coordination. EXAM: CT HEAD WITHOUT CONTRAST TECHNIQUE: Contiguous axial images were obtained from the base of the skull through the vertex without intravenous contrast. COMPARISON:  No pertinent prior exams available for comparison. FINDINGS: Brain: Mild cerebral atrophy. Asymmetric extra-axial CSF density overlying the left cerebral hemisphere measuring up to 6 mm in thickness. This may be related to patient head positioning at the time of examination. A chronic subdural hematoma or subdural hygroma cannot be excluded. 4 mm rightward midline shift. Again, this may be related to patient head positioning or mass effect from a left subdural collection. Mild ill-defined hypoattenuation within the cerebral white matter, nonspecific, but compatible with chronic small vessel ischemic disease. There is no acute intracranial hemorrhage. No demarcated cortical infarct. No evidence of intracranial mass. No midline shift. Vascular: No hyperdense vessel.  Atherosclerotic calcifications. Skull: Normal. Negative for fracture or focal lesion. Sinuses/Orbits: Visualized orbits show no acute finding. Moderate mucosal thickening within the right maxillary sinus with associated chronic reactive osteitis. Mild right frontal and bilateral ethmoid sinus mucosal thickening. ASPECTS [facility] Stroke Program Early CT Score) - Ganglionic level infarction (caudate, lentiform nuclei, internal capsule, insula, M1-M3 cortex): 7 - Supraganglionic infarction (M4-M6 cortex): 3 Total score (0-10 with 10 being normal): 10 These results were communicated to Dr. Otelia Limes At 4:31 pmon 2/13/2022by  text page via the [facility] messaging system. IMPRESSION: No evidence of acute intracranial hemorrhage or acute infarct. ASPECTS is 10. Asymmetric extra-axial CSF density overlying the left cerebral hemisphere measuring up to 6 mm in thickness. This may be related to patient head  positioning at the time of the examination. A chronic subdural hematoma or subdural hygroma cannot be excluded. 4 mm rightward midline shift. Again, this may be related to patient head positioning or mass effect from a left subdural collection. Mild cerebral atrophy and chronic small vessel ischemic disease. Paranasal sinus disease, most notably chronic right maxillary sinusitis. Electronically Signed   By: Jackey Loge DO   On: 11/14/2020 16:33   VAS US CAROTID (at [facility] and [facility] only)  Result Date: 11/15/2020 Carotid Arterial Duplex Study Indications:       TIA, visual disturbance LT, weakness RT. Risk Factors:      Past history of smoking. Other Factors:     Limited medical history available- patient reports he has not                    seen physician in approximately 30 years. Limitations        Today's exam was limited due to the patient's inability or                    unwillingness to cooperate, the high bifurcation of the                    carotid and the patient's respiratory variation. Comparison Study:  11-15-2020 CTA neck showed RT ICA occlusion, RT ECA stenosis,                    RT vertebral occlusion, LT CCA occlusion, and LT ICA                    occlusion. Performing Technologist: Jean Rosenthal RDMS  Examination Guidelines: A complete evaluation includes B-mode imaging, spectral Doppler, color Doppler, and power Doppler as needed of all accessible portions of each vessel. Bilateral testing is considered an integral part of a complete examination. Limited examinations for reoccurring indications may be performed as noted.  Right Carotid Findings: +----------+--------+--------+--------+------------------+---------------------+            PSV cm/sEDV cm/sStenosisPlaque DescriptionComments              +----------+--------+--------+--------+------------------+---------------------+ CCA Prox  101     11                                intimal thickening    +----------+--------+--------+--------+------------------+---------------------+ CCA Distal42      13                                intimal thickening    +----------+--------+--------+--------+------------------+---------------------+ ICA Prox                  Occluded                                        +----------+--------+--------+--------+------------------+---------------------+ ICA Mid                   Occluded                                        +----------+--------+--------+--------+------------------+---------------------+ ICA Distal  Not visualized        +----------+--------+--------+--------+------------------+---------------------+ ECA       600             >50%    heterogenous      Nyquist limit-                                                            velocities greater                                                        than 600              +----------+--------+--------+--------+------------------+---------------------+ +----------+--------+-------+--------+-------------------+           PSV cm/sEDV cmsDescribeArm Pressure (mmHG) +----------+--------+-------+--------+-------------------+ [id number]            Stenotic                    +----------+--------+-------+--------+-------------------+ +---------+--------+--------+--------------+ VertebralPSV cm/sEDV cm/sNot identified +---------+--------+--------+--------------+  Left Carotid Findings: +----------+-------+--------+--------+----------------+-----------------------+           PSV    EDV cm/sStenosisPlaque          Comments                          cm/s                    Description                             +----------+-------+--------+--------+----------------+-----------------------+ CCA Prox  47                                     Near occlusion- slow                                                     flow                    +----------+-------+--------+--------+----------------+-----------------------+ CCA Mid   42                                     Near occlusion- slow                                                     flow                    +----------+-------+--------+--------+----------------+-----------------------+ CCA Distal               Occludedhypoechoic                              +----------+-------+--------+--------+----------------+-----------------------+  ICA Prox                 Occludedhyperechoic                             +----------+-------+--------+--------+----------------+-----------------------+ ICA Mid                  Occludedhyperechoic                             +----------+-------+--------+--------+----------------+-----------------------+ ICA Distal                                       Not visualized          +----------+-------+--------+--------+----------------+-----------------------+ ECA       137    28                                                      +----------+-------+--------+--------+----------------+-----------------------+ +----------+--------+--------+----------------+-------------------+           PSV cm/sEDV cm/sDescribe        Arm Pressure (mmHG) +----------+--------+--------+----------------+-------------------+ [id number]             Multiphasic, WNL                    +----------+--------+--------+----------------+-------------------+ +---------+--------+---+--------+--+---------+ VertebralPSV cm/s123EDV cm/s51Antegrade +---------+--------+---+--------+--+---------+   Summary: Right Carotid: Evidence consistent with a total  occlusion of the right ICA. The                ECA appears >50% stenosed. Left Carotid: Evidence consistent with near occlusion of the left ICA. The CCA               appears occluded. The ECA appears <50% stenosed. Vertebrals:  Left vertebral artery demonstrates antegrade flow. Right vertebral              artery was not visualized. Subclavians: Normal flow hemodynamics were seen in bilateral subclavian              arteries. *See table(s) above for measurements and observations.  Electronically signed by Delia Heady MD on 11/15/2020 at 12:18:20 PM.    Final     ***  Charlton Amor, PA-C 11/15/2020

## 2020-11-15 NOTE — Evaluation (Signed)
Clinical/Bedside Swallow Evaluation Patient Details  Name: Martin Collins MRN: [id number] Date of Birth: 09/19/1934  Today's Date: 11/15/2020 Time: SLP Start Time (ACUTE ONLY): 1535 SLP Stop Time (ACUTE ONLY): 1600 SLP Time Calculation (min) (ACUTE ONLY): 25 min  Past Medical History:  Past Medical History:  Diagnosis Date  . Acid reflux    Past Surgical History: History reviewed. No pertinent surgical history. HPI:  87yo male admitted 11/14/20 with left eye blurred vision, BUE tremors, right weakness.. PMH: GERD. Has not been to a doctor in 30 years. 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 Fiancee at bedside reports no difficulty swallowing prior to admit. Pt has upper and lower dentures, and appears to have right facial weakness. He was unable to follow commands for CN exam. Individual ice chips were given x2. The first fell out of his mouth, the second was removed by SLP due to lack of oral manipulation. Recommend NPO status until pt is able to demonstrate safety with PO trials. RN and MD informed. SLP will continue to follow.   SLP Visit Diagnosis: Dysphagia, unspecified (R13.10)    Aspiration Risk  Severe aspiration risk;Risk for inadequate nutrition/hydration    Diet Recommendation NPO   Medication Administration: Via alternative means    Follow up Recommendations Other (comment) (tbd)      Frequency and Duration min 2x/week  1 week;2 weeks       Prognosis Prognosis for Safe Diet Advancement: Fair Barriers to Reach Goals: Language deficits;Severity of deficits      Swallow Study   General Date of Onset: 11/14/20 HPI: 87yo male admitted 11/14/20 with left eye blurred vision, BUE tremors, right weakness.. PMH: GERD. Has not been to a doctor in 30 years. MRI = Small acute infarcts within the left parietal cortex  and left frontoparietal white matter Type of Study: Bedside Swallow Evaluation Previous Swallow Assessment: none Diet Prior to this Study: Regular;Thin liquids Temperature Spikes Noted: No Respiratory Status: Nasal cannula History of Recent Intubation: No Behavior/Cognition: Lethargic/Drowsy;Doesn't follow directions Oral Cavity Assessment: Within Functional Limits Oral Care Completed by SLP: Yes Oral Cavity - Dentition: Dentures, bottom;Dentures, top Self-Feeding Abilities: Total assist Patient Positioning: Upright in bed Baseline Vocal Quality: Not observed Volitional Cough: Cognitively unable to elicit Volitional Swallow: Unable to elicit    Oral/Motor/Sensory Function Overall Oral Motor/Sensory Function: Other (comment) (unable to assess due to lethargy, however right facial weakness is noted)   Ice Chips Ice chips: Impaired Oral Phase Impairments: Poor awareness of bolus;Reduced lingual movement/coordination;Reduced labial seal;Impaired mastication Oral Phase Functional Implications: Right anterior spillage;Oral holding    Honore Wipperfurth B. Murvin Natal, [facility], CCC-SLP Speech Language Pathologist Office: [phone] Pager: [phone]  Leigh Aurora 11/15/2020,4:11 PM

## 2020-11-15 NOTE — ED Notes (Signed)
Pt placed on hospital bed

## 2020-11-15 NOTE — Progress Notes (Signed)
PT Cancellation Note  Patient Details Name: Martin Collins MRN: [id number] DOB: 11-Jul-1934   Cancelled Treatment:    Reason Eval/Treat Not Completed: Medical issues which prohibited therapy.  Talked with nursing and was encouraged not to see pt today. Will re-attempt with pt tomorrow if medically more stable.     Ivar Drape 11/15/2020, 4:07 PM   Samul Dada, PT MS Acute Rehab Dept. Number: [facility] [id number] and [facility] [phone]

## 2020-11-16 ENCOUNTER — Inpatient Hospital Stay (HOSPITAL_COMMUNITY): Payer: Medicare PPO

## 2020-11-16 DIAGNOSIS — I6523 Occlusion and stenosis of bilateral carotid arteries: Secondary | ICD-10-CM

## 2020-11-16 DIAGNOSIS — I63033 Cerebral infarction due to thrombosis of bilateral carotid arteries: Secondary | ICD-10-CM

## 2020-11-16 DIAGNOSIS — Z515 Encounter for palliative care: Secondary | ICD-10-CM | POA: Diagnosis not present

## 2020-11-16 DIAGNOSIS — Z7189 Other specified counseling: Secondary | ICD-10-CM | POA: Diagnosis not present

## 2020-11-16 DIAGNOSIS — R778 Other specified abnormalities of plasma proteins: Secondary | ICD-10-CM | POA: Diagnosis not present

## 2020-11-16 DIAGNOSIS — I63512 Cerebral infarction due to unspecified occlusion or stenosis of left middle cerebral artery: Secondary | ICD-10-CM | POA: Diagnosis not present

## 2020-11-16 DIAGNOSIS — Z66 Do not resuscitate: Secondary | ICD-10-CM

## 2020-11-16 LAB — ECHOCARDIOGRAM COMPLETE BUBBLE STUDY
Area-P 1/2: 3.65 cm2
Calc EF: 30.9 %
S' Lateral: 5.35 cm
Single Plane A2C EF: 39.3 %
Single Plane A4C EF: 20.8 %

## 2020-11-16 LAB — GLUCOSE, CAPILLARY
Glucose-Capillary: 94 mg/dL (ref 70–99)
Glucose-Capillary: 108 mg/dL — ABNORMAL HIGH (ref 70–99)

## 2020-11-16 IMAGING — CT CT HEAD W/O CM
3 of 4 series · 14 of 47 positions shown, 16 images · non-contrast
Comparison: CT angiogram head/neck [DATE]. MRI brain
[DATE]. MRA head/neck [DATE].
COMPARISON: CT angiogram head/neck [DATE]. MRI brain
[DATE]. MRA head/neck [DATE].

Addendum:
CLINICAL DATA: Stroke, follow-up worsening exam.

EXAM:
CT HEAD WITHOUT CONTRAST
TECHNIQUE: Contiguous axial images were obtained from the base of the skull
through the vertex without intravenous contrast.

[Series 3: head 2.0 h70h · axial · 0.54mm/px · z∈[-94,+56]mm · 8 of 95 slices shown, 10 images]
[im 10/95  brain]
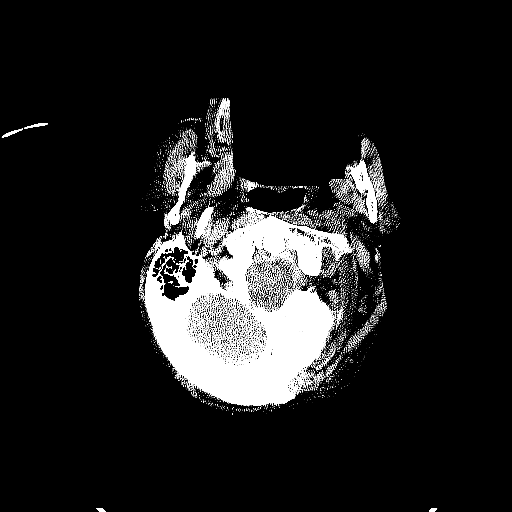
[im 10/95  bone]
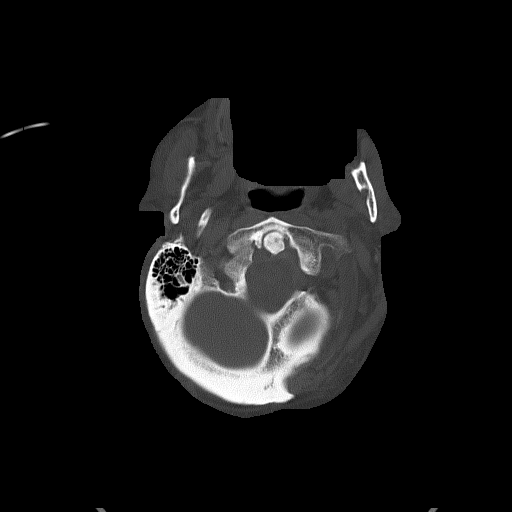
[im 19/95  brain]
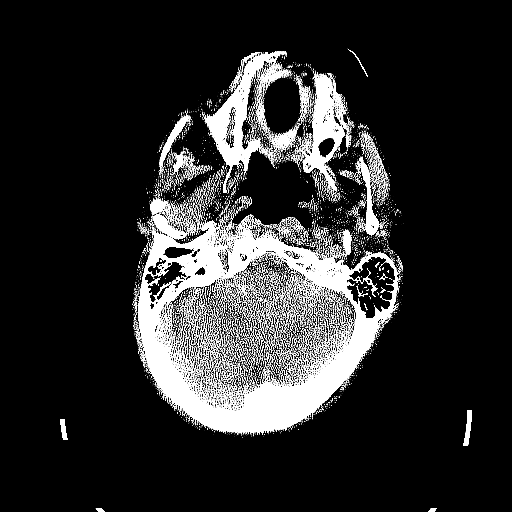
[im 29/95  brain]
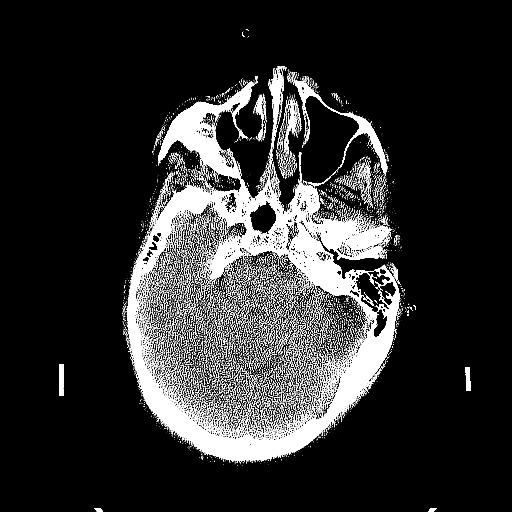
[im 43/95  brain]
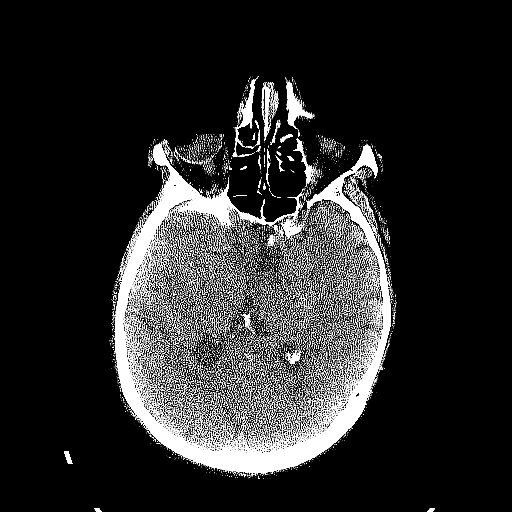
[im 52/95  brain]
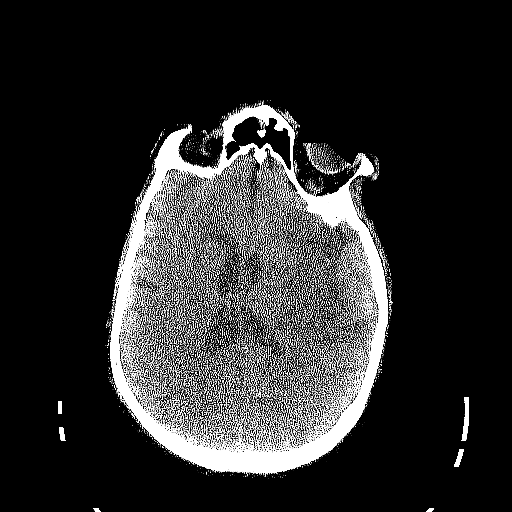
[im 52/95  bone]
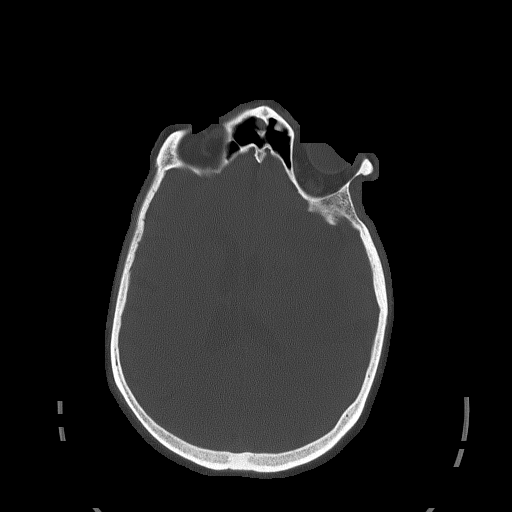
[im 66/95  brain]
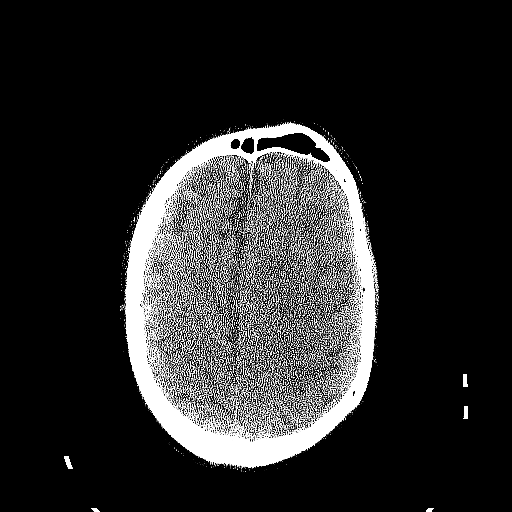
[im 76/95  brain]
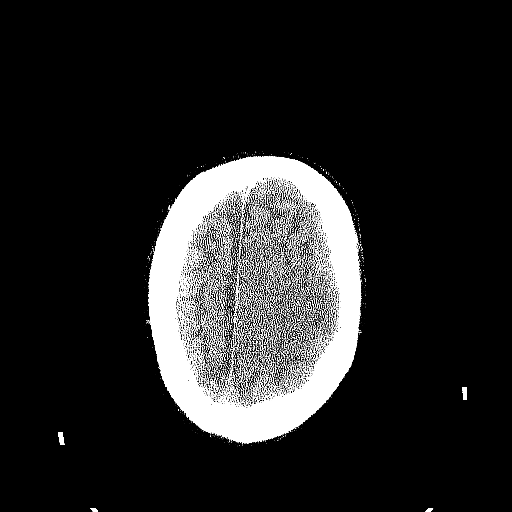
[im 85/95  brain]
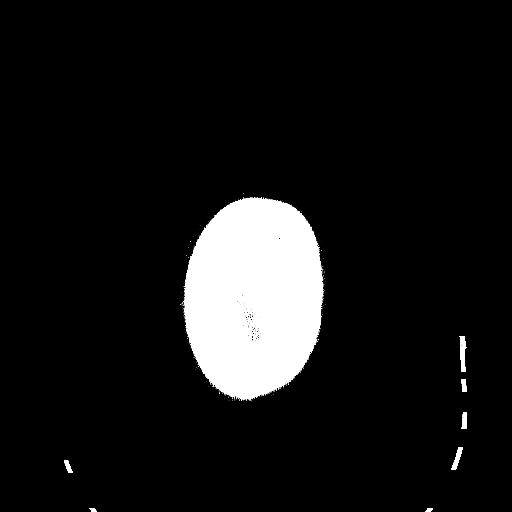

[Series 5: head 3.0 mpr cor · coronal · 0.41mm/px · 3 of 84 slices shown]
[im 28/84  brain]
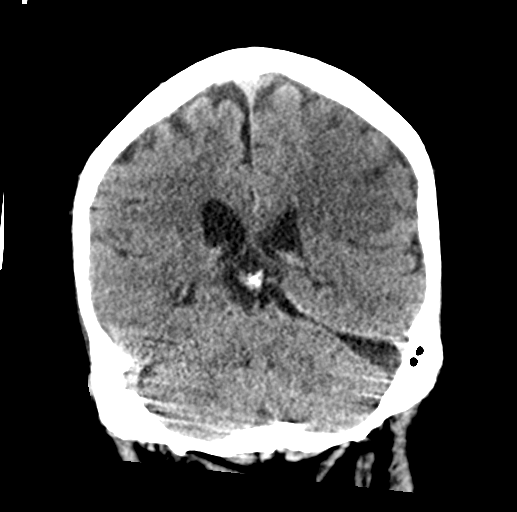
[im 37/84  brain]
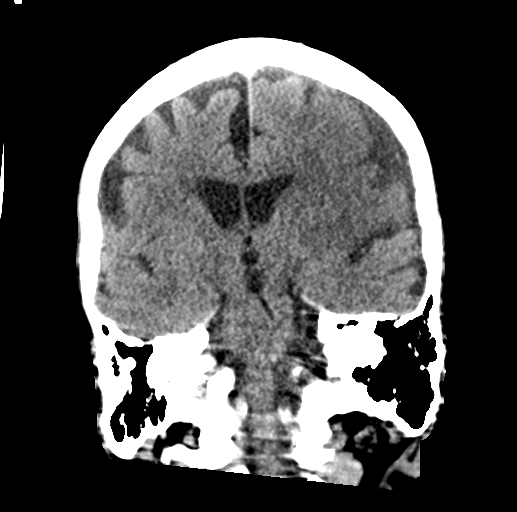
[im 47/84  brain]
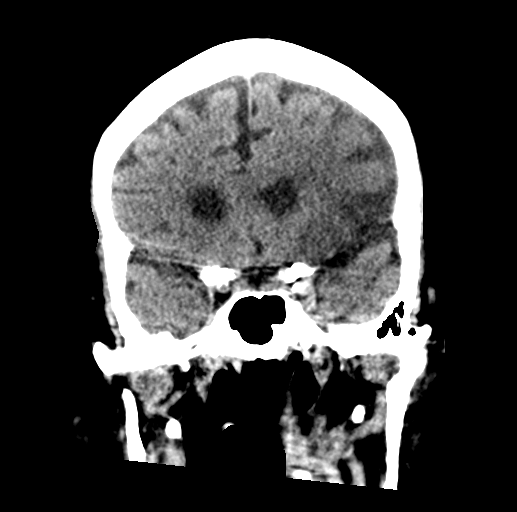

[Series 6: head 3.0 mpr sag · sagittal · 0.39mm/px · 3 of 59 slices shown]
[im 20/59  brain]
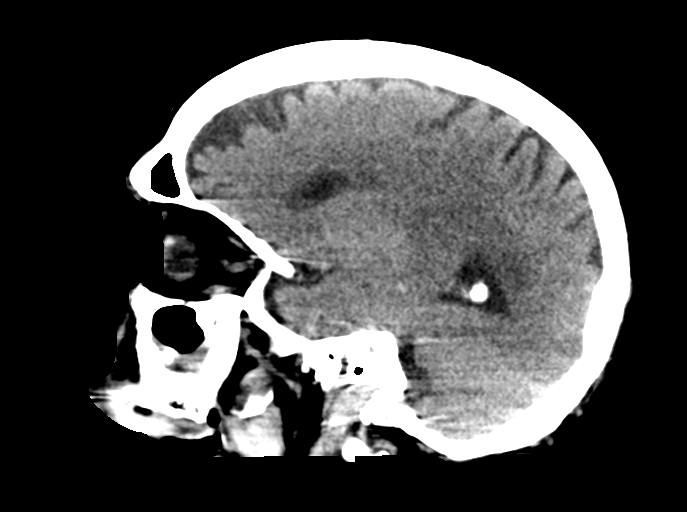
[im 30/59  brain]
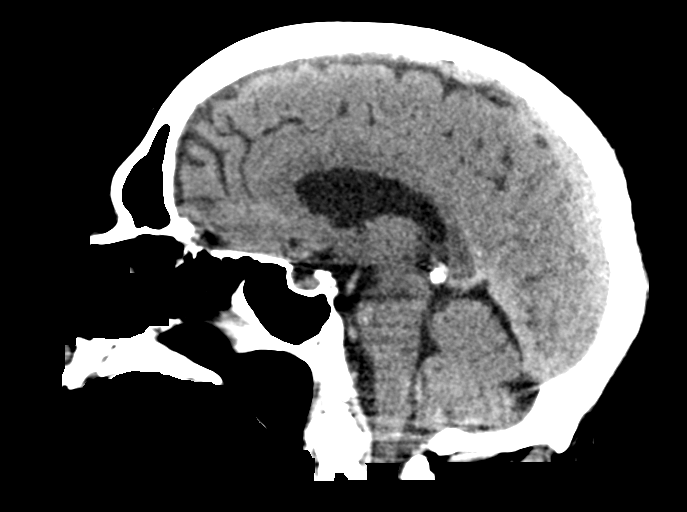
[im 39/59  brain]
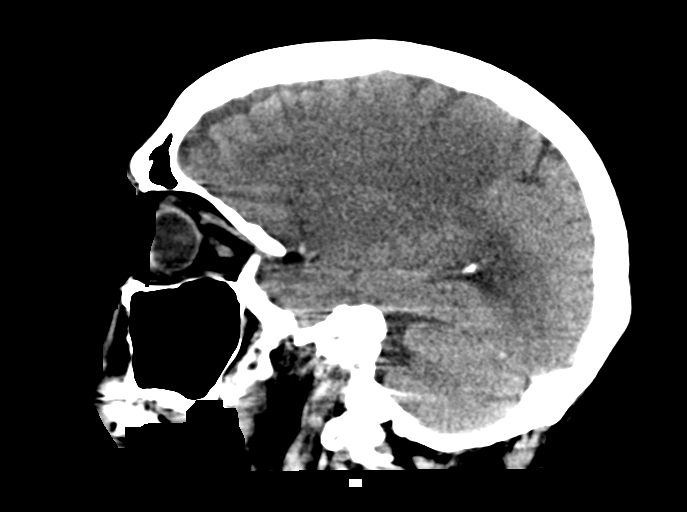

[14 of 47 positions shown; findings below may reference images not displayed]

FINDINGS: Brain:

Mild cerebral atrophy.

New from the prior brain MRI of [DATE], there is a large acute
cortical and subcortical left MCA territory infarct within the left
frontal and parietal lobes, left insula and subinsular region as
well as left basal ganglia. Mild regional mass effect with only
subtle partial effacement of the left lateral ventricle. No evidence
of hemorrhagic conversion.

Background mild ill-defined hypoattenuation within the cerebral
white matter is nonspecific, but compatible with chronic small
vessel ischemic disease.

Unchanged asymmetric CSF intensity prominence overlying the left
frontal lobe, which appear to reflect asymmetric prominence of the
subarachnoid space on the prior brain MRI.

Vascular: There is now hyperdensity of the M1 left middle cerebral
artery, likely reflecting interval occlusion of this vessel (series
4, image 19).

Skull: Normal. Negative for fracture or focal lesion.

Sinuses/Orbits: Visualized orbits show no acute finding. Moderate
right maxillary sinus mucosal thickening with associated chronic
reactive osteitis.
IMPRESSION: Interval development of a large acute left MCA territory infarct.
Mild associated mass effect at this time with only subtle partial
effacement of the left lateral ventricle. No evidence of hemorrhagic
conversion.

There is now hyperdensity of the M1 left middle cerebral artery,
likely reflecting interval occlusion of this vessel.

Stable background cerebral atrophy and chronic small vessel ischemic
disease.

ADDENDUM:
These results were called by telephone at the time of interpretation
on [DATE] at [DATE] to provider SOLOMON TSEDA , who
verbally acknowledged these results.

*** End of Addendum ***
FINDINGS: Brain:

Mild cerebral atrophy.

New from the prior brain MRI of [DATE], there is a large acute
cortical and subcortical left MCA territory infarct within the left
frontal and parietal lobes, left insula and subinsular region as
well as left basal ganglia. Mild regional mass effect with only
subtle partial effacement of the left lateral ventricle. No evidence
of hemorrhagic conversion.

Background mild ill-defined hypoattenuation within the cerebral
white matter is nonspecific, but compatible with chronic small
vessel ischemic disease.

Unchanged asymmetric CSF intensity prominence overlying the left
frontal lobe, which appear to reflect asymmetric prominence of the
subarachnoid space on the prior brain MRI.

Vascular: There is now hyperdensity of the M1 left middle cerebral
artery, likely reflecting interval occlusion of this vessel (series
4, image 19).

Skull: Normal. Negative for fracture or focal lesion.

Sinuses/Orbits: Visualized orbits show no acute finding. Moderate
right maxillary sinus mucosal thickening with associated chronic
reactive osteitis.
IMPRESSION: Interval development of a large acute left MCA territory infarct.
Mild associated mass effect at this time with only subtle partial
effacement of the left lateral ventricle. No evidence of hemorrhagic
conversion.

There is now hyperdensity of the M1 left middle cerebral artery,
likely reflecting interval occlusion of this vessel.

Stable background cerebral atrophy and chronic small vessel ischemic
disease.

## 2020-11-16 IMAGING — CT CT ANGIO CHEST
3 of 7 series · 16 of 46 positions shown · IV contrast (OMNI)
Comparison: Chest CT [DATE] and CT a head and neck [DATE]

CLINICAL DATA: Suspected pulmonary arteriovenous malformation. Left
MCA territory infarct.

EXAM:
CT ANGIOGRAPHY CHEST
TECHNIQUE: Multidetector CT imaging through the chest was performed using the
standard protocol during bolus administration of intravenous
contrast. Multiplanar reconstructed images and MIPs were obtained
and reviewed to evaluate the vascular anatomy.
CONTRAST:  80mL OMNIPAQUE IOHEXOL 350 MG/ML SOLN

[Series 7: coronals · coronal · 0.65mm/px · 3 of 147 slices shown]
[im 37/147  soft-tissue]
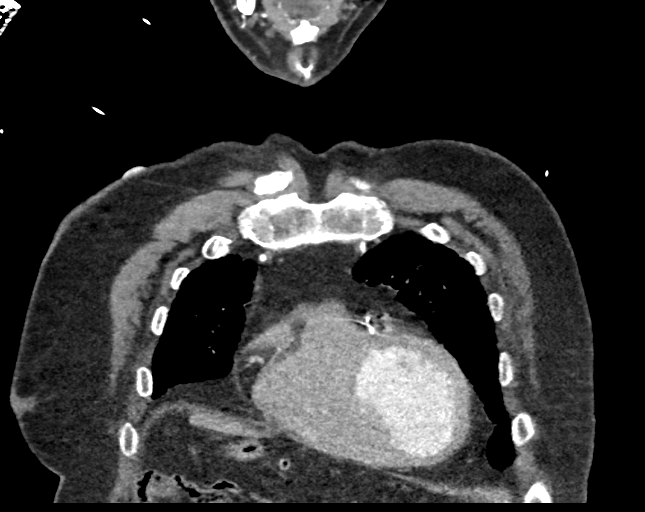
[im 74/147  soft-tissue]
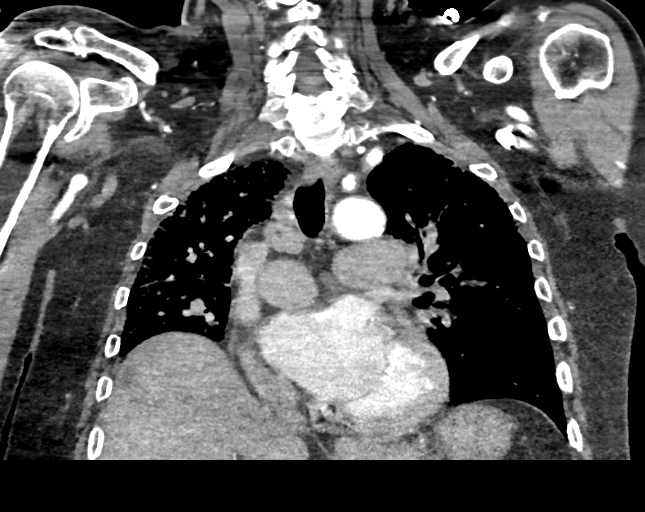
[im 110/147  soft-tissue]
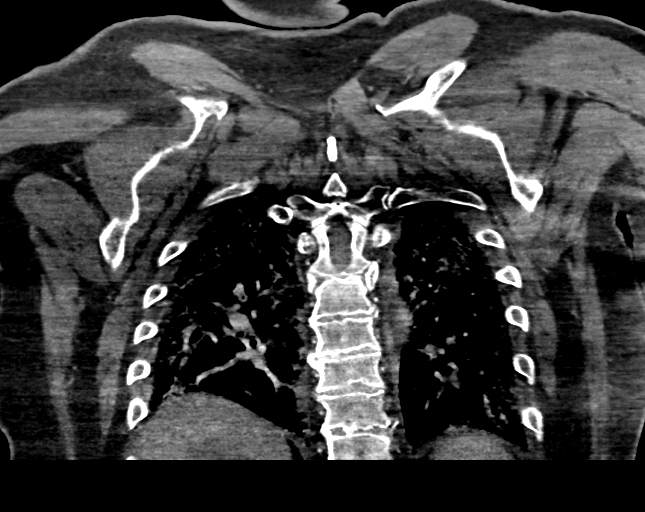

[Series 10: dissection 3.0 i30f 3 · axial · 0.80mm/px · z∈[-454,-205]mm · 10 of 103 slices shown]
[im 10/103  lung]
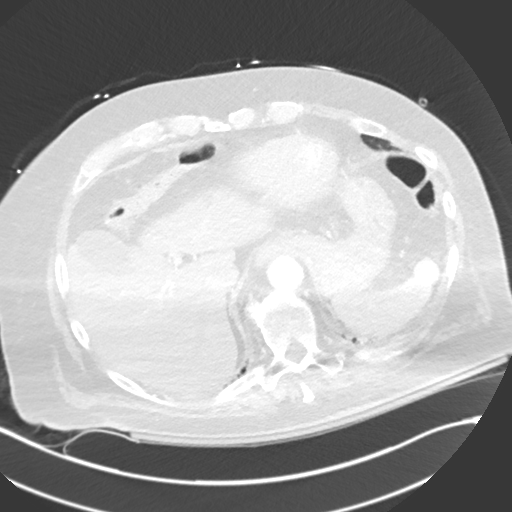
[im 19/103  soft-tissue]
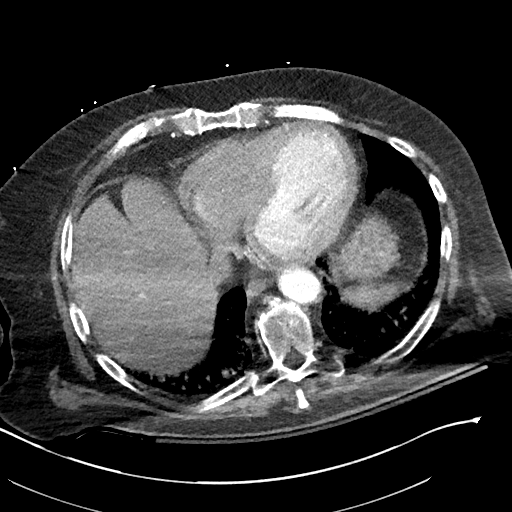
[im 28/103  lung]
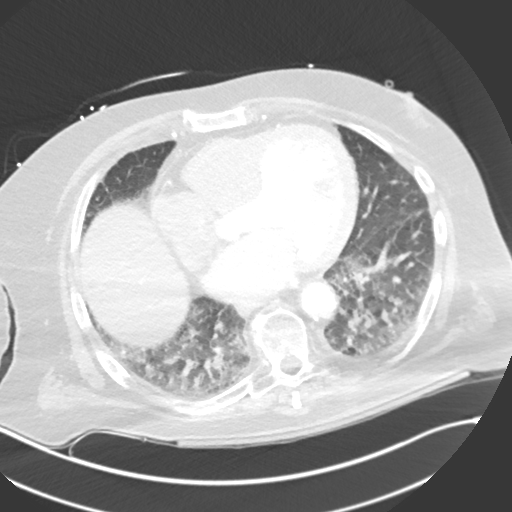
[im 38/103  soft-tissue]
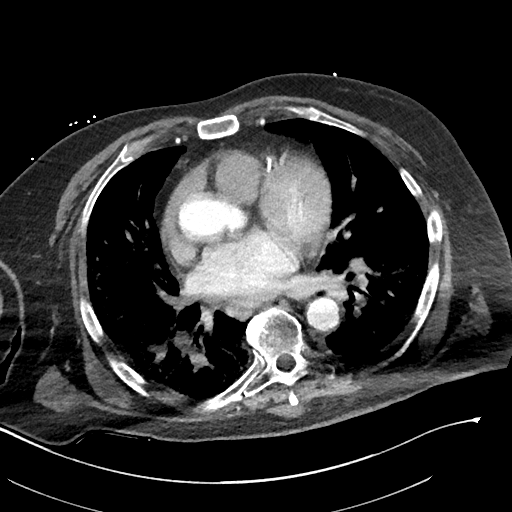
[im 47/103  lung]
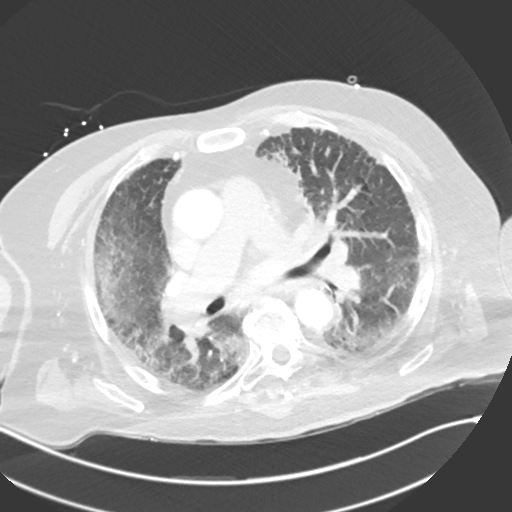
[im 56/103  soft-tissue]
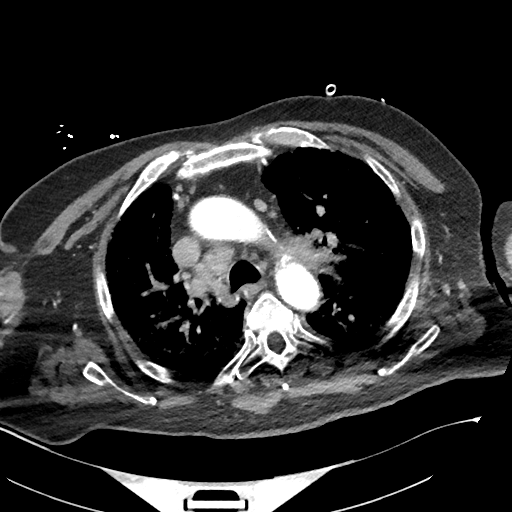
[im 65/103  lung]
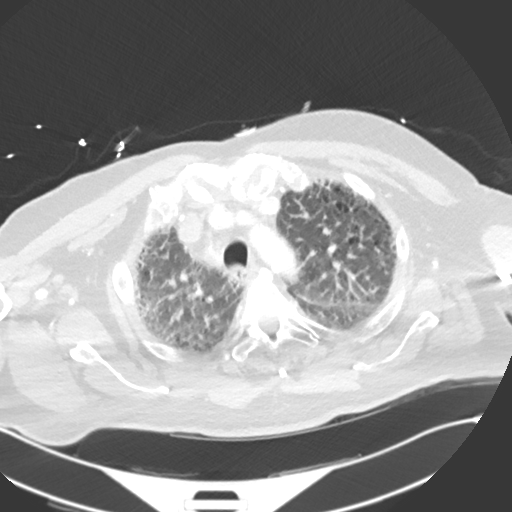
[im 75/103  soft-tissue]
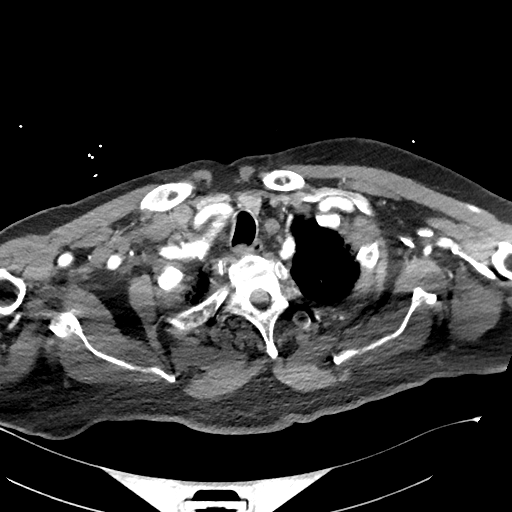
[im 84/103  lung]
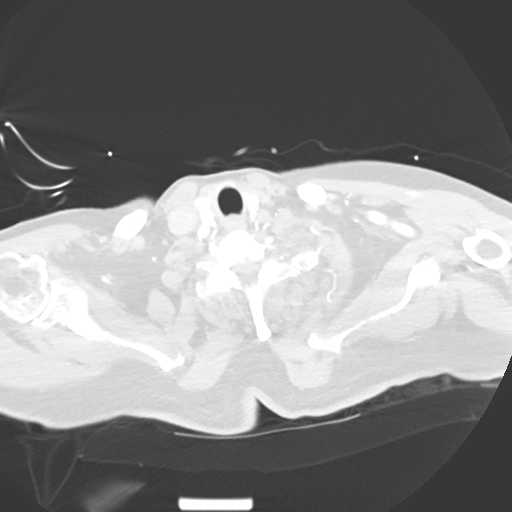
[im 93/103  soft-tissue]
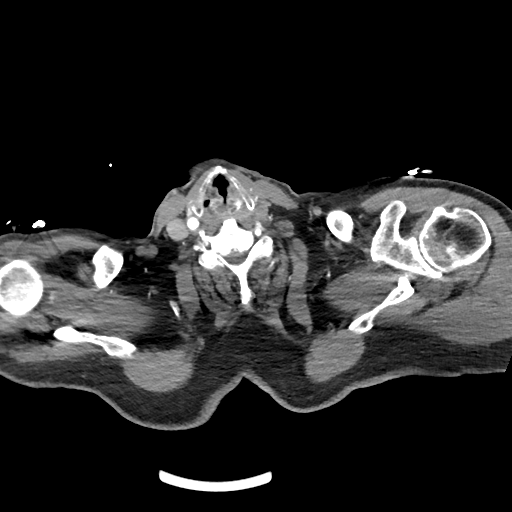

[Series 14: without 5.0 i31f 1 · axial · non-contrast · 0.79mm/px · z∈[-487,-392]mm · 3 of 68 slices shown]
[im 10/68  lung]
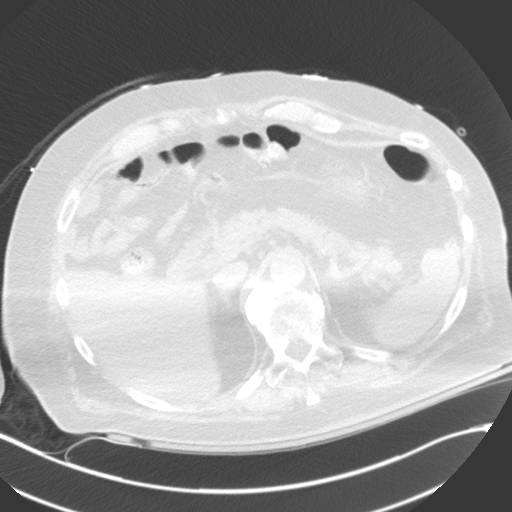
[im 20/68  lung]
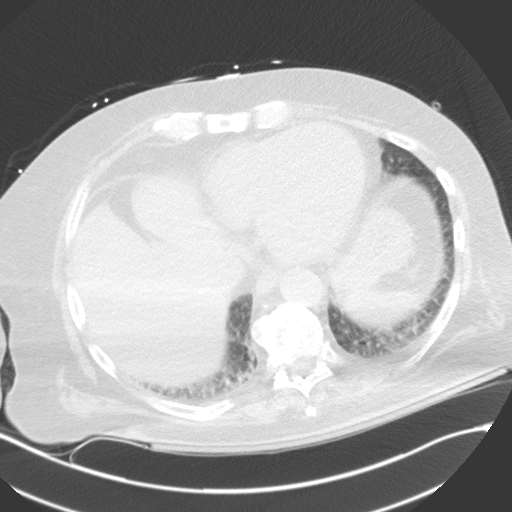
[im 29/68  lung]
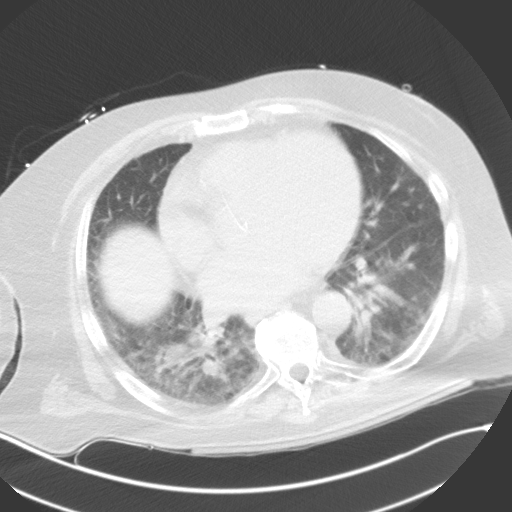

[16 of 46 positions shown; findings below may reference images not displayed]

FINDINGS: CTA CHEST FINDINGS

Cardiovascular: Contrast is predominantly in the thoracic aorta and
there is poor opacification of pulmonary arteries. As a result,
limited evaluation for a pulmonary arteriovenous malformation. There
are coronary artery calcifications. Atherosclerotic calcifications
involving the thoracic aorta. Ascending thoracic aorta measures up
to 3.7 cm. Typical three-vessel arch anatomy. However, the left
common carotid artery occludes just beyond the origin and similar to
the neck CTA findings. Brachiocephalic artery is patent with
atherosclerotic disease. The right subclavian artery and right
common carotid artery are patent. Left subclavian artery is patent
with mild narrowing near the origin and proximal aspect. Proximal
left vertebral artery is patent. Origin of the right vertebral
artery is occluded with distal reconstitution. Right vertebral
artery findings are similar to the recent CTA neck. Proximal
descending thoracic aorta measures 2.7 cm. Heart size is slightly
prominent but no significant pericardial effusion.

Mediastinum/Nodes: Enlarged prevascular lymph nodes measuring up to
1.5 cm in the short axis on sequence 9, image 2. Enlarged
pretracheal lymph node with central necrosis on sequence 10 image 46
measuring 2.3 cm in short axis. Small supraclavicular lymph nodes.
No significant axillary lymph node enlargement. Prominent left hilar
tissue on sequence 10 image 59 measures 1.1 cm in the short axis.
Slightly prominent right hilar tissue is difficult to evaluate due
to lack of contrast in the pulmonary arteries. Esophagus is
unremarkable. No focal abnormality involving the thyroid tissue.

Lungs/Pleura: Trachea and mainstem bronchi are patent. Scattered
areas of emphysema in the upper lobes. Two adjacent poorly defined
nodules in the left upper lobe best seen on the coronal reformats,
sequence 7, images 96 and 95. These nodules measure roughly 8 mm and
6 mm. These nodules are also seen on the axial images on sequence 9
image 31. Scattered areas of interstitial thickening. No large
pleural effusions. Hazy parenchymal densities in both lower lobes
could represent atelectasis and/or edema. Parenchymal lung findings
are similar to the exam on [DATE].

Upper abdomen: Multiple calcified gallstones.

Musculoskeletal: Multilevel degenerative changes in the thoracic
spine. No suspicious bone lesions.

Review of the MIP images confirms the above findings.
IMPRESSION: 1. Mediastinal and hilar lymphadenopathy of unknown etiology.
2. Patchy areas of interstitial thickening with hazy densities in
lower lungs. Differential diagnosis includes asymmetric pulmonary
edema and/or atypical infection.
3. Two poorly defined nodules in the left upper lobe, largest
measuring 8 mm. Based on the configuration, these nodules could be
inflammatory or infectious but indeterminate. Non-contrast chest CT
at 3-6 months is recommended. If the nodules are stable at time of
repeat CT, then future CT at 18-24 months (from today's scan) is
considered optional for low-risk patients, but is recommended for
high-risk patients. This recommendation follows the consensus
statement: Guidelines for Management of Incidental Pulmonary Nodules
Detected on CT Images: From the [HOSPITAL] [5Z]; Radiology
[5Z]; [DATE].
4. Aortic Atherosclerosis ([5Z]-[5Z]) and Emphysema ([5Z]-[5Z]).
5. Pulmonary arteriovenous malformation cannot be evaluated because
the pulmonary arteries are not opacified on this examination.
6. Occlusion of the left common carotid artery just beyond the
origin. Occlusion of the right vertebral artery at the origin with
distal reconstitution. These vascular findings are similar to the
recent neck CTA.
7. Coronary artery calcifications.
8. Cholelithiasis.

## 2020-11-16 MED ORDER — POLYVINYL ALCOHOL 1.4 % OP SOLN
1.0000 [drp] | Freq: Four times a day (QID) | OPHTHALMIC | Status: DC | PRN
  Filled 2020-11-16: qty 15

## 2020-11-16 MED ORDER — MORPHINE SULFATE (PF) 2 MG/ML IV SOLN
1.0000 mg | INTRAVENOUS | Status: DC | PRN
  Administered 2020-11-16 – 2020-11-17 (×4): 2 mg via INTRAVENOUS
  Administered 2020-11-17: 1 mg via INTRAVENOUS
  Filled 2020-11-16 (×5): qty 1

## 2020-11-16 MED ORDER — IOHEXOL 350 MG/ML SOLN
80.0000 mL | Freq: Once | INTRAVENOUS | Status: AC | PRN
  Administered 2020-11-16: 80 mL via INTRAVENOUS

## 2020-11-16 MED ORDER — LORAZEPAM 2 MG/ML IJ SOLN: 1.0000 mg | INTRAMUSCULAR | Status: DC | PRN

## 2020-11-16 MED ORDER — ACETAMINOPHEN 325 MG PO TABS: 650.0000 mg | ORAL_TABLET | Freq: Four times a day (QID) | ORAL | Status: DC | PRN

## 2020-11-16 MED ORDER — HALOPERIDOL LACTATE 5 MG/ML IJ SOLN: 0.5000 mg | INTRAMUSCULAR | Status: DC | PRN

## 2020-11-16 MED ORDER — SODIUM CHLORIDE 0.9 % IV SOLN: INTRAVENOUS | Status: DC

## 2020-11-16 MED ORDER — BIOTENE DRY MOUTH MT LIQD: 15.0000 mL | OROMUCOSAL | Status: DC | PRN

## 2020-11-16 MED ORDER — ONDANSETRON HCL 4 MG/2ML IJ SOLN: 4.0000 mg | Freq: Four times a day (QID) | INTRAMUSCULAR | Status: DC | PRN

## 2020-11-16 MED ORDER — GLYCOPYRROLATE 0.2 MG/ML IJ SOLN
0.3000 mg | INTRAMUSCULAR | Status: DC | PRN
  Administered 2020-11-16: 0.3 mg via INTRAVENOUS
  Filled 2020-11-16: qty 2

## 2020-11-16 MED ORDER — LORAZEPAM 2 MG/ML IJ SOLN: 0.5000 mg | Freq: Four times a day (QID) | INTRAMUSCULAR | Status: DC

## 2020-11-16 NOTE — Progress Notes (Signed)
PT Cancellation Note  Patient Details Name: [name] MRN: [id number] DOB: 05-19-1934   Cancelled Treatment:    Reason Eval/Treat Not Completed: Patient not medically ready (pt with further decline, hold PT per Dr Pearlean Brownie)  Lyanne Co, PT  Acute Rehab Services  Pager [phone] Office [phone]   Lawana Chambers Gara Kincade 11/16/2020, 11:52 AM

## 2020-11-16 NOTE — Progress Notes (Signed)
SLP Cancellation Note  Patient Details Name: Martin Collins MRN: [id number] DOB: 11/08/1933   Cancelled treatment:       Reason Eval/Treat Not Completed: Medical issues which prohibited therapy; pt pending repeat CT of head for worsening mental status. Will follow for goals of care ,PO readiness    Ardyth Gal MA, CCC-SLP Acute Rehabilitation Services   11/16/2020, 11:21 AM

## 2020-11-16 NOTE — Progress Notes (Signed)
Chaplain responded to referral from Chaplain Ellen.  Chaplain met with pt's fiance offering ministry of care and support.  Fiance spoke of events surrounding pt being here, of his daughter and son arriving around midnight to see their dad. Fiance reported her pastor had been here for a visit.  Chaplain invited fiance to page throughout the night if a need arises.      Chaplain   

## 2020-11-16 NOTE — Progress Notes (Signed)
PROGRESS NOTE    Martin Collins  ZHY:[id number] DOB: 05-12-1934 DOA: 11/14/2020 PCP: Patient, No Pcp Per    Chief Complaint  Patient presents with  . Code Stroke    Brief Narrative:  87 year old gentleman prior history of GERD past tobacco abuse initially presents to [facility] on 11/14/2020 with suspected acute ischemic stroke.  Patient had an acute onset of left monocular visual blurring, bilateral upper extremity tremors and falling to the right.  He was brought to ED as code stroke was seen by neurology.  He was deemed not a candidate for TPA as he is out of the time window.  Initial CT of the head findings not consistent with LVO.  MRI of the brain shows Small acute infarcts within the left parietal cortex and left frontoparietal white matter. CT angio of the head and neck showed Occlusion of the left common carotid artery immediately beyond the origin. Reconstituted flow in the left external carotid artery. No flow in the left cervical internal carotid artery.  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 Severe stenosis of the right external carotid artery.Occlusion of the right vertebral artery at its origin but reconstituted as a small vessel by cervical collaterals.   EKG was abnormal with biphasic P waves and troponins elevated.  Cardiology consulted for evaluation of paroxysmal atrial fibrillation.  Cardiology recommends TTE with bubble study to evaluate for intracardiac thrombus and  outpatient monitor to assess for atrial fibrillation given his acute CVA.  CT angio of the neck also revealed mediastinal adenopathy in the Peri carinal region and right paratracheal region including a necrotic appearing node measuring 2.5 cm at the lower right paratracheal region. CT chest reveals nodules and mediastinal adenopathy.   Pt deteriorated rapidly and on exam today he was unresponsive .  CT head without contrast revealed Interval development of a large acute left MCA  territory infarct. Mild associated mass effect at this time with only subtle partial effacement of the left lateral ventricle. Dr Pearlean Brownie with neurology spoke to the family daughter / fiancee regarding the poor prognosis, and family decided to transition to comfort measures.   Palliative care consulted for further recommendations.    Assessment & Plan:   Principal Problem:   Stroke [facility]) Active Problems:   Elevated troponin   Elevated serum creatinine   Acid reflux   Stroke-like symptoms   Small acute infarcts within the left parietal cortex and left frontoparietal white matter/  Occlusion of the left common carotid artery immediately beyond the origin.  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 Severe stenosis of the right external carotid artery. Admitted for further stroke work up.  Pt deteriorated rapidly and on exam today he was unresponsive .  CT head without contrast revealed Interval development of a large acute left MCA territory infarct. Mild associated mass effect at this time with only subtle partial effacement of the left lateral ventricle. Dr Pearlean Brownie with neurology spoke to the family daughter / fiancee regarding the poor prognosis, and family decided to transition to comfort measures.   LDL is 92, Hemoglobin A1c is 4.8.   Acute toxic and metabolic encephalopathy:  Worsening mental status this am and CT head without contrast ordered showed large acute left MCA territory infarct. Mild associated mass effect at this time with only subtle partial effacement of the left lateral ventricle. Dr Pearlean Brownie with neurology spoke to the family daughter / fiancee regarding the poor prognosis, and family decided to transition to  comfort measures.   Permissive Hypertension:    Abnormal lung imaging with mediastinal adenopathy and necrotic lung nodule about 2.5 cm 30 year smoking history and extensive emphysema.  CT chest reviewed.   Stage 3 a CKD?  Pt  hasn't seen a physician in the last 20 years. Unclear if his renal parameters are at baseline vs acute.    Elevated troponins:  Probably from demand ischemia.    Mild normocytic anemia:    DVT prophylaxis comfort care.  Code Status: (DNR Family Communication: Fiance at bedside.  Disposition:   Status is: Observation  The patient will require care spanning > 2 midnights and should be moved to inpatient because: Unsafe d/c plan, transitioned to comfort measures.   Dispo: The patient is from: Home              Anticipated d/c is to: pending.               Anticipated d/c date is: 2 days              Patient currently is not medically stable to d/c.   Difficult to place patient No       Level of care: Telemetry Medical Consultants:   Neurology  Cardiology.    Procedures:   CT ANGIO HEAD W OR WO CONTRAST MR Brain Wo Contrast (neuro protocol) MR ANGIO  Head and Neck without contrast  CT head  Code stroke.     Antimicrobials: none.    Subjective: Unresponsive.   Objective: Vitals:   11/16/20 0358 11/16/20 0500 11/16/20 0820 11/16/20 1116  BP: (!) 161/84  (!) 164/71 (!) 191/68  Pulse: 61  (!) 55 (!) 53  Resp: 19  20 20   Temp: 98.1 F (36.7 C)  98.2 F (36.8 C) (!) 97.5 F (36.4 C)  TempSrc: Oral  Oral Oral  SpO2: 96%  98% 93%  Weight:  111.9 kg    Height:        Intake/Output Summary (Last 24 hours) at 11/16/2020 1324 Last data filed at 11/16/2020 0203 Gross per 24 hour  Intake -  Output 700 ml  Net -700 ml   Filed Weights   11/14/20 1600 11/14/20 1641 11/16/20 0500  Weight: 114.1 kg 114.1 kg 111.9 kg    Examination:  General exam: Ill-appearing elderly gentleman, not responding, not following commands Respiratory system: air entry fair, no tachypnea.  Cardiovascular system: S1 & S2 heard, irregular,  Gastrointestinal system: Abdomen is soft, bowel sounds minimal.  Central nervous system: Unresponsive.  Obtunded Extremities: no pedal  edema.  Skin: No rashes, seen Psychiatry: cannot be seen.     Data Reviewed: I have personally reviewed following labs and imaging studies  CBC: Recent Labs  Lab 11/14/20 1610 11/14/20 1614 11/15/20 0414  WBC 8.4  --  9.4  NEUTROABS 5.0  --   --   HGB 15.2 16.0 12.9*  HCT 46.8 47.0 39.3  MCV 94.7  --  95.2  PLT 191  --  149*    Basic Metabolic Panel: Recent Labs  Lab 11/14/20 1610 11/14/20 1614 11/15/20 0351  NA 140 140 139  K 4.1 4.0 4.4  CL 105 107 108  CO2 23  --  19*  GLUCOSE 123* 112* 98  BUN 20 23 18   CREATININE 1.63* 1.50* 1.43*  CALCIUM 9.3  --  9.1  MG  --   --  2.0    GFR: Estimated Creatinine Clearance: 51.9 mL/min (A) (by C-G formula based on  SCr of 1.43 mg/dL (H)).  Liver Function Tests: Recent Labs  Lab 11/14/20 1610  AST 31  ALT 14  ALKPHOS 78  BILITOT 1.2  PROT 7.3  ALBUMIN 3.8    CBG: Recent Labs  Lab 11/14/20 1608 11/16/20 1011  GLUCAP 116* 94     Recent Results (from the past 240 hour(s))  SARS CORONAVIRUS 2 (TAT 6-24 HRS) Nasopharyngeal Nasopharyngeal Swab     Status: None   Collection Time: 11/14/20  5:20 PM   Specimen: Nasopharyngeal Swab  Result Value Ref Range Status   SARS Coronavirus 2 NEGATIVE NEGATIVE Final    Comment: (NOTE) SARS-CoV-2 target nucleic acids are NOT DETECTED.  The SARS-CoV-2 RNA is generally detectable in upper and lower respiratory specimens during the acute phase of infection. Negative results do not preclude SARS-CoV-2 infection, do not rule out co-infections with other pathogens, and should not be used as the sole basis for treatment or other patient management decisions. Negative results must be combined with clinical observations, patient history, and epidemiological information. The expected result is Negative.  Fact Sheet for Patients: HairSlick.no  Fact Sheet for Healthcare Providers: quierodirigir.com  This test is not yet  approved or cleared by the [place] FDA and  has been authorized for detection and/or diagnosis of SARS-CoV-2 by FDA under an Emergency Use Authorization (EUA). This EUA will remain  in effect (meaning this test can be used) for the duration of the COVID-19 declaration under Se ction 564(b)(1) of the Act, 21 U.S.C. section 360bbb-3(b)(1), unless the authorization is terminated or revoked sooner.  Performed at [facility], [address], [place], [place] [address]          Radiology Studies: CT ANGIO HEAD W OR WO CONTRAST  Result Date: 11/15/2020 CLINICAL DATA:  Bilateral ICA occlusion show by MRI. Small left hemisphere infarctions. EXAM: CT ANGIOGRAPHY HEAD AND NECK TECHNIQUE: Multidetector CT imaging of the head and neck was performed using the standard protocol during bolus administration of intravenous contrast. Multiplanar CT image reconstructions and MIPs were obtained to evaluate the vascular anatomy. Carotid stenosis measurements (when applicable) are obtained utilizing NASCET criteria, using the distal internal carotid diameter as the denominator. CONTRAST:  44mL OMNIPAQUE IOHEXOL 350 MG/ML SOLN COMPARISON:  None. FINDINGS: CTA NECK FINDINGS Aortic arch: Aortic atherosclerosis.  Branching pattern is normal. Right carotid system: Pronounced atherosclerotic change of the innominate artery. Right common carotid artery shows intimal thickening but is patent to the bifurcation. Severe soft plaque at the bifurcation with occlusion of the ICA at its origin. Severe stenosis of the proximal right ECA. No reconstitution in the neck. Left carotid system: Common carotid artery occluded immediately beyond the origin. No flow seen beyond that to the region of the bifurcation. There is some reconstituted flow in the external carotid artery. No flow in the cervical internal carotid artery. Vertebral arteries: Left vertebral artery origin is widely patent. The left vertebral artery is patent  through the cervical region to the foramen magnum. The right vertebral artery is occluded at its origin but reconstituted as a small vessel by cervical collaterals. Some antegrade flow occurs to the level of the foramen magnum. Skeleton: Ordinary cervical spondylosis. Other neck: No neck mass or neck lymphadenopathy. Upper chest: Patient has mediastinal adenopathy in the Peri carinal region and right paratracheal region including a necrotic appearing node measuring 2.5 cm at the lower right paratracheal region. The lungs show emphysema and scarring. There is either fluid in the major fissure on the  right or some consolidation of the dorsal aspect of the inferior right upper lobe. Complete chest evaluation suggested to rule out malignancy. Review of the MIP images confirms the above findings CTA HEAD FINDINGS Anterior circulation: No ICA flow at the skull base. Reconstitution in the distal siphon regions by external to internal collaterals. Supraclinoid internal carotid arteries are widely patent. Both anterior and middle cerebral vessels show flow. No large or medium vessel occlusion seen. Posterior circulation: Both vertebral arteries are patent through the foramen magnum to the basilar. There is some atherosclerotic disease of the vertebral artery V4 segments but no stenosis greater than 30%. Basilar artery shows mild atherosclerotic change but no flow limiting stenosis. Superior cerebellar and posterior cerebral arteries show flow. There is primary fetal origin of the right PCA. Venous sinuses: Patent and normal. Anatomic variants: None significant. Review of the MIP images confirms the above findings IMPRESSION: 1. Atherosclerotic disease of the aortic arch. 2. 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 Reconstituted flow in the left external carotid artery. No flow in the left cervical internal carotid artery. 3. 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  Severe stenosis of the right external carotid artery. 4. Occlusion of the right vertebral artery at its origin but reconstituted as a small vessel by cervical collaterals. 5. No intracranial large or medium vessel occlusion or correctable proximal stenosis. 6. Mediastinal adenopathy, including a necrotic appearing node at the lower right paratracheal region measuring 2.5 cm. Complete chest evaluation suggested to rule out malignancy. 7. Emphysema and scarring. Fluid in the major fissure on the right or some consolidation of the dorsal aspect of the inferior right upper lobe. 8. Emphysema and aortic atherosclerosis. Aortic Atherosclerosis (ICD10-I70.0) and Emphysema (ICD10-J43.9). Electronically Signed   By: Paulina Fusi M.D.   On: 11/15/2020 00:37   CT HEAD WO CONTRAST  Addendum Date: 11/16/2020   ADDENDUM REPORT: 11/16/2020 13:16 ADDENDUM: These results were called by telephone at the time of interpretation on 11/16/2020 at 1:05 pm to provider [facility] , who verbally acknowledged these results. Electronically Signed   By: Jackey Loge DO   On: 11/16/2020 13:16   Result Date: 11/16/2020 CLINICAL DATA:  Stroke, follow-up worsening exam. EXAM: CT HEAD WITHOUT CONTRAST TECHNIQUE: Contiguous axial images were obtained from the base of the skull through the vertex without intravenous contrast. COMPARISON:  CT angiogram head/neck 11/15/2020. MRI brain 11/14/2020. MRA head/neck 11/14/2020. FINDINGS: Brain: Mild cerebral atrophy. New from the prior brain MRI of 11/14/2020, there is a large acute cortical and subcortical left MCA territory infarct within the left frontal and parietal lobes, left insula and subinsular region as well as left basal ganglia. Mild regional mass effect with only subtle partial effacement of the left lateral ventricle. No evidence of hemorrhagic conversion. Background mild ill-defined hypoattenuation within the cerebral white matter is nonspecific, but compatible with chronic  small vessel ischemic disease. Unchanged asymmetric CSF intensity prominence overlying the left frontal lobe, which appear to reflect asymmetric prominence of the subarachnoid space on the prior brain MRI. Vascular: There is now hyperdensity of the M1 left middle cerebral artery, likely reflecting interval occlusion of this vessel (series 4, image 19). Skull: Normal. Negative for fracture or focal lesion. Sinuses/Orbits: Visualized orbits show no acute finding. Moderate right maxillary sinus mucosal thickening with associated chronic reactive osteitis. IMPRESSION: Interval development of a large acute left MCA territory infarct. Mild associated mass effect at this time with only subtle partial effacement of  the left lateral ventricle. No evidence of hemorrhagic conversion. There is now hyperdensity of the M1 left middle cerebral artery, likely reflecting interval occlusion of this vessel. Stable background cerebral atrophy and chronic small vessel ischemic disease. Electronically Signed: By: Jackey Loge DO On: 11/16/2020 13:02   CT ANGIO NECK W OR WO CONTRAST  Result Date: 11/15/2020 CLINICAL DATA:  Bilateral ICA occlusion show by MRI. Small left hemisphere infarctions. EXAM: CT ANGIOGRAPHY HEAD AND NECK TECHNIQUE: Multidetector CT imaging of the head and neck was performed using the standard protocol during bolus administration of intravenous contrast. Multiplanar CT image reconstructions and MIPs were obtained to evaluate the vascular anatomy. Carotid stenosis measurements (when applicable) are obtained utilizing NASCET criteria, using the distal internal carotid diameter as the denominator. CONTRAST:  77mL OMNIPAQUE IOHEXOL 350 MG/ML SOLN COMPARISON:  None. FINDINGS: CTA NECK FINDINGS Aortic arch: Aortic atherosclerosis.  Branching pattern is normal. Right carotid system: Pronounced atherosclerotic change of the innominate artery. Right common carotid artery shows intimal thickening but is patent to the  bifurcation. Severe soft plaque at the bifurcation with occlusion of the ICA at its origin. Severe stenosis of the proximal right ECA. No reconstitution in the neck. Left carotid system: Common carotid artery occluded immediately beyond the origin. No flow seen beyond that to the region of the bifurcation. There is some reconstituted flow in the external carotid artery. No flow in the cervical internal carotid artery. Vertebral arteries: Left vertebral artery origin is widely patent. The left vertebral artery is patent through the cervical region to the foramen magnum. The right vertebral artery is occluded at its origin but reconstituted as a small vessel by cervical collaterals. Some antegrade flow occurs to the level of the foramen magnum. Skeleton: Ordinary cervical spondylosis. Other neck: No neck mass or neck lymphadenopathy. Upper chest: Patient has mediastinal adenopathy in the Peri carinal region and right paratracheal region including a necrotic appearing node measuring 2.5 cm at the lower right paratracheal region. The lungs show emphysema and scarring. There is either fluid in the major fissure on the right or some consolidation of the dorsal aspect of the inferior right upper lobe. Complete chest evaluation suggested to rule out malignancy. Review of the MIP images confirms the above findings CTA HEAD FINDINGS Anterior circulation: No ICA flow at the skull base. Reconstitution in the distal siphon regions by external to internal collaterals. Supraclinoid internal carotid arteries are widely patent. Both anterior and middle cerebral vessels show flow. No large or medium vessel occlusion seen. Posterior circulation: Both vertebral arteries are patent through the foramen magnum to the basilar. There is some atherosclerotic disease of the vertebral artery V4 segments but no stenosis greater than 30%. Basilar artery shows mild atherosclerotic change but no flow limiting stenosis. Superior cerebellar and  posterior cerebral arteries show flow. There is primary fetal origin of the right PCA. Venous sinuses: Patent and normal. Anatomic variants: None significant. Review of the MIP images confirms the above findings IMPRESSION: 1. Atherosclerotic disease of the aortic arch. 2. 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 Reconstituted flow in the left external carotid artery. No flow in the left cervical internal carotid artery. 3. 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 Severe stenosis of the right external carotid artery. 4. Occlusion of the right vertebral artery at its origin but reconstituted as a small vessel by cervical collaterals. 5. No intracranial large or medium vessel occlusion or correctable proximal stenosis. 6. Mediastinal adenopathy, including  a necrotic appearing node at the lower right paratracheal region measuring 2.5 cm. Complete chest evaluation suggested to rule out malignancy. 7. Emphysema and scarring. Fluid in the major fissure on the right or some consolidation of the dorsal aspect of the inferior right upper lobe. 8. Emphysema and aortic atherosclerosis. Aortic Atherosclerosis (ICD10-I70.0) and Emphysema (ICD10-J43.9). Electronically Signed   By: Paulina Fusi M.D.   On: 11/15/2020 00:37   CT CHEST WO CONTRAST  Result Date: 11/15/2020 CLINICAL DATA:  Lung nodule. EXAM: CT CHEST WITHOUT CONTRAST TECHNIQUE: Multidetector CT imaging of the chest was performed following the standard protocol without IV contrast. COMPARISON:  November 14, 2020. FINDINGS: Cardiovascular: Atherosclerosis of thoracic aorta is noted without aneurysm formation. Mild cardiomegaly is noted. No pericardial effusion is noted. Coronary artery calcifications are noted. Mediastinum/Nodes: Thyroid gland and esophagus are unremarkable. 1.9 cm right paratracheal lymph node is noted. 2 cm precarinal lymph node is noted. 12 mm AP window lymph node is noted. Lungs/Pleura:  No pneumothorax is noted. Minimal pleural effusions are noted. Emphysematous disease is noted in both upper lobes. Interstitial densities are noted throughout both lungs which may represent pulmonary edema, although atypical infection or scarring cannot be excluded. 7 mm nodule is noted in the left lung apex best seen on image number 32 of series 7. Upper Abdomen: Cholelithiasis is noted. Nonobstructive right renal calculus is noted. Musculoskeletal: No chest wall mass or suspicious bone lesions identified. IMPRESSION: 1. 7 mm nodule is noted in the left lung apex. Non-contrast chest CT at 6-12 months is recommended. If the nodule is stable at time of repeat CT, then future CT at 18-24 months (from today's scan) is considered optional for low-risk patients, but is recommended for high-risk patients. This recommendation follows the consensus statement: Guidelines for Management of Incidental Pulmonary Nodules Detected on CT Images: From the Fleischner Society 2017; Radiology 2017; 284:228-243. 2. Interstitial densities are noted throughout both lungs which may represent pulmonary edema, although atypical infection or scarring cannot be excluded. 3. Minimal bilateral pleural effusions are noted. 4. Enlarged mediastinal adenopathy is noted which may be inflammatory or neoplastic in etiology. Clinical correlation is recommended. 5. Coronary artery calcifications are noted. 6. Cholelithiasis. 7. Nonobstructive right renal calculus. 8. Emphysema and aortic atherosclerosis. Aortic Atherosclerosis (ICD10-I70.0) and Emphysema (ICD10-J43.9). Electronically Signed   By: Lupita Raider M.D.   On: 11/15/2020 09:55   MR ANGIO HEAD WO CONTRAST  Result Date: 11/14/2020 CLINICAL DATA:  Neuro deficit, acute, stroke suspected. EXAM: MR HEAD WITHOUT CONTRAST MR CIRCLE OF WILLIS WITHOUT CONTRAST MRA OF THE NECK WITHOUT CONTRAST TECHNIQUE: Multiplanar, multiecho pulse sequences of the brain, circle of willis and surrounding  structures were obtained without intravenous contrast. Angiographic images of the neck were obtained using MRA technique without intravenous contrast. COMPARISON:  Noncontrast head CT performed earlier today 11/14/2020. FINDINGS: MR HEAD FINDINGS Brain: Intermittently motion degraded examination. Most notably, there is mild-to-moderate motion degradation of the coronal T2 weighted sequence. Mild-to-moderate cerebral atrophy. Small acute cortical infarct within the left parietal lobe (series 5, image 9). Additional small curvilinear acute infarct within the left parietal white matter (for instance as seen on series 5, image 85). There are a few additional punctate acute infarcts within the left frontal lobe white matter (for instance as seen on series 5, image 86). Mild multifocal T2/FLAIR hyperintensity within the cerebral white matter and pons is nonspecific, but compatible with chronic small vessel ischemic disease. Tiny chronic infarct within the right cerebellar hemisphere. Redemonstrated asymmetric CSF intensity  prominence overlying the left frontal lobe. However, this appears to reflect asymmetric prominence of the subarachnoid space rather than a subdural collection. Unchanged 4 mm rightward midline shift, indeterminate in etiology. No evidence of intracranial mass. No chronic intracranial blood products. 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 Otherwise reported below. Skull and upper cervical spine: No focal marrow lesion. Sinuses/Orbits: Visualized orbits show no acute finding. Moderate right maxillary sinus mucosal thickening with associated chronic reactive osteitis. Trace bilateral ethmoid sinus mucosal thickening. 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 Reconstitution of flow related signal within the supraclinoid internal carotid arteries bilaterally. The M1  middle cerebral arteries are patent. Attenuated appearance of multiple proximal left M2 MCA branches. However, no left M2 proximal branch occlusion is identified. The anterior cerebral arteries are patent. Moderate stenosis within the A1 right ACA. No intracranial aneurysm is identified. The intracranial vertebral arteries are patent. The basilar artery is patent. Possible short segment fenestration within the proximal basilar artery. The posterior cerebral arteries are patent. Fetal origin right posterior cerebral artery. A small left posterior communicating artery is present. MRA NECK FINDINGS Mild intermittent motion degradation. The right CCA is patent to the bifurcation without significant stenosis. The right ICA is poorly delineated shortly beyond its origin, likely occluded or near occluded. The left CCA is poorly delineated shortly beyond its origin, likely occluded or near occluded. The left ICA is nonvisualized, likely occluded or near occluded. The dominant left vertebral artery is patent within the neck. Apparent moderate stenosis at the origin of this vessel. Flow related signal is seen intermittently within the non dominant cervical right vertebral artery. These results were called by telephone at the time of interpretation on 11/14/2020 at 11:06 pm to provider Dr. Wilford Corner, who verbally acknowledged these results. IMPRESSION: MRI brain: 1. Intermittently motion degraded examination. 2. Small acute infarcts within the left parietal cortex and left frontoparietal white matter, as described. 3. Background mild-to-moderate cerebral atrophy and mild chronic small vessel ischemic disease. 4. Tiny chronic right cerebellar infarct. 5. Redemonstrated asymmetric CSF intensity prominence overlying the left frontal lobe. This appears to reflect asymmetric prominence of the subarachnoid space rather than a subdural collection. MRA neck: 1. The right ICA is poorly delineated shortly beyond its origin, likely occluded  or near occluded. 2. The left CCA is nonvisualized except for its very proximal portion. The cervical left ICA is also nonvisualized. These vessels are likely occluded or near occluded. 3. The dominant left vertebral artery is patent within the neck. Apparent moderate stenosis at the origin of this vessel. Flow related signal is seen intermittently within the non-dominant cervical right vertebral artery. MRA head: 1. 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 Reconstitution of the intracranial ICAs at the supraclinoid level. 2. Attenuated appearance of multiple proximal M2 left MCA branches. However, no left M2 proximal branch occlusion is identified. 3. Moderate stenosis within the A1 right ACA. Electronically Signed   By: Jackey Loge DO   On: 11/14/2020 23:10   MR ANGIO NECK WO CONTRAST  Result Date: 11/14/2020 CLINICAL DATA:  Neuro deficit, acute, stroke suspected. EXAM: MR HEAD WITHOUT CONTRAST MR CIRCLE OF WILLIS WITHOUT CONTRAST MRA OF THE NECK WITHOUT CONTRAST TECHNIQUE: Multiplanar, multiecho pulse sequences of the brain, circle of willis and surrounding structures were obtained without intravenous contrast. Angiographic images of the neck were obtained using MRA technique without  intravenous contrast. COMPARISON:  Noncontrast head CT performed earlier today 11/14/2020. FINDINGS: MR HEAD FINDINGS Brain: Intermittently motion degraded examination. Most notably, there is mild-to-moderate motion degradation of the coronal T2 weighted sequence. Mild-to-moderate cerebral atrophy. Small acute cortical infarct within the left parietal lobe (series 5, image 9). Additional small curvilinear acute infarct within the left parietal white matter (for instance as seen on series 5, image 85). There are a few additional punctate acute infarcts within the left frontal lobe white matter (for instance as seen on series 5, image 86). Mild multifocal T2/FLAIR  hyperintensity within the cerebral white matter and pons is nonspecific, but compatible with chronic small vessel ischemic disease. Tiny chronic infarct within the right cerebellar hemisphere. Redemonstrated asymmetric CSF intensity prominence overlying the left frontal lobe. However, this appears to reflect asymmetric prominence of the subarachnoid space rather than a subdural collection. Unchanged 4 mm rightward midline shift, indeterminate in etiology. No evidence of intracranial mass. No chronic intracranial blood products. 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 Otherwise reported below. Skull and upper cervical spine: No focal marrow lesion. Sinuses/Orbits: Visualized orbits show no acute finding. Moderate right maxillary sinus mucosal thickening with associated chronic reactive osteitis. Trace bilateral ethmoid sinus mucosal thickening. 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 Reconstitution of flow related signal within the supraclinoid internal carotid arteries bilaterally. The M1 middle cerebral arteries are patent. Attenuated appearance of multiple proximal left M2 MCA branches. However, no left M2 proximal branch occlusion is identified. The anterior cerebral arteries are patent. Moderate stenosis within the A1 right ACA. No intracranial aneurysm is identified. The intracranial vertebral arteries are patent. The basilar artery is patent. Possible short segment fenestration within the proximal basilar artery. The posterior cerebral arteries are patent. Fetal origin right posterior cerebral artery. A small left posterior communicating artery is present. MRA NECK FINDINGS Mild intermittent motion degradation. The right CCA is patent to the bifurcation without significant stenosis. The right ICA is poorly delineated shortly beyond its origin, likely occluded or near  occluded. The left CCA is poorly delineated shortly beyond its origin, likely occluded or near occluded. The left ICA is nonvisualized, likely occluded or near occluded. The dominant left vertebral artery is patent within the neck. Apparent moderate stenosis at the origin of this vessel. Flow related signal is seen intermittently within the non dominant cervical right vertebral artery. These results were called by telephone at the time of interpretation on 11/14/2020 at 11:06 pm to provider Dr. Wilford Corner, who verbally acknowledged these results. IMPRESSION: MRI brain: 1. Intermittently motion degraded examination. 2. Small acute infarcts within the left parietal cortex and left frontoparietal white matter, as described. 3. Background mild-to-moderate cerebral atrophy and mild chronic small vessel ischemic disease. 4. Tiny chronic right cerebellar infarct. 5. Redemonstrated asymmetric CSF intensity prominence overlying the left frontal lobe. This appears to reflect asymmetric prominence of the subarachnoid space rather than a subdural collection. MRA neck: 1. The right ICA is poorly delineated shortly beyond its origin, likely occluded or near occluded. 2. The left CCA is nonvisualized except for its very proximal portion. The cervical left ICA is also nonvisualized. These vessels are likely occluded or near occluded. 3. The dominant left vertebral artery is patent within the neck. Apparent moderate stenosis at the origin of this vessel. Flow related signal is seen intermittently within the non-dominant cervical right vertebral artery. MRA head: 1. The intracranial internal carotid  arteries remain occluded or near occluded throughout the siphon region. Reconstitution of the intracranial ICAs at the supraclinoid level. 2. Attenuated appearance of multiple proximal M2 left MCA branches. However, no left M2 proximal branch occlusion is identified. 3. Moderate stenosis within the A1 right ACA. Electronically Signed   By: Jackey LogeKyle   Golden DO   On: 11/14/2020 23:10   MR Brain Wo Contrast (neuro protocol)  Result Date: 11/14/2020 CLINICAL DATA:  Neuro deficit, acute, stroke suspected. EXAM: MR HEAD WITHOUT CONTRAST MR CIRCLE OF WILLIS WITHOUT CONTRAST MRA OF THE NECK WITHOUT CONTRAST TECHNIQUE: Multiplanar, multiecho pulse sequences of the brain, circle of willis and surrounding structures were obtained without intravenous contrast. Angiographic images of the neck were obtained using MRA technique without intravenous contrast. COMPARISON:  Noncontrast head CT performed earlier today 11/14/2020. FINDINGS: MR HEAD FINDINGS Brain: Intermittently motion degraded examination. Most notably, there is mild-to-moderate motion degradation of the coronal T2 weighted sequence. Mild-to-moderate cerebral atrophy. Small acute cortical infarct within the left parietal lobe (series 5, image 9). Additional small curvilinear acute infarct within the left parietal white matter (for instance as seen on series 5, image 85). There are a few additional punctate acute infarcts within the left frontal lobe white matter (for instance as seen on series 5, image 86). Mild multifocal T2/FLAIR hyperintensity within the cerebral white matter and pons is nonspecific, but compatible with chronic small vessel ischemic disease. Tiny chronic infarct within the right cerebellar hemisphere. Redemonstrated asymmetric CSF intensity prominence overlying the left frontal lobe. However, this appears to reflect asymmetric prominence of the subarachnoid space rather than a subdural collection. Unchanged 4 mm rightward midline shift, indeterminate in etiology. No evidence of intracranial mass. No chronic intracranial blood products. 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 Otherwise reported below. Skull and upper cervical spine: No focal marrow lesion. Sinuses/Orbits: Visualized orbits show no acute finding. Moderate right maxillary  sinus mucosal thickening with associated chronic reactive osteitis. Trace bilateral ethmoid sinus mucosal thickening. 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 Reconstitution of flow related signal within the supraclinoid internal carotid arteries bilaterally. The M1 middle cerebral arteries are patent. Attenuated appearance of multiple proximal left M2 MCA branches. However, no left M2 proximal branch occlusion is identified. The anterior cerebral arteries are patent. Moderate stenosis within the A1 right ACA. No intracranial aneurysm is identified. The intracranial vertebral arteries are patent. The basilar artery is patent. Possible short segment fenestration within the proximal basilar artery. The posterior cerebral arteries are patent. Fetal origin right posterior cerebral artery. A small left posterior communicating artery is present. MRA NECK FINDINGS Mild intermittent motion degradation. The right CCA is patent to the bifurcation without significant stenosis. The right ICA is poorly delineated shortly beyond its origin, likely occluded or near occluded. The left CCA is poorly delineated shortly beyond its origin, likely occluded or near occluded. The left ICA is nonvisualized, likely occluded or near occluded. The dominant left vertebral artery is patent within the neck. Apparent moderate stenosis at the origin of this vessel. Flow related signal is seen intermittently within the non dominant cervical right vertebral artery. These results were called by telephone at the time of interpretation on 11/14/2020 at 11:06 pm to provider Dr. Wilford CornerArora, who verbally acknowledged these results. IMPRESSION: MRI brain: 1. Intermittently motion degraded examination. 2. Small acute infarcts within the left parietal cortex and left frontoparietal white matter, as described. 3. Background  mild-to-moderate cerebral atrophy and mild  chronic small vessel ischemic disease. 4. Tiny chronic right cerebellar infarct. 5. Redemonstrated asymmetric CSF intensity prominence overlying the left frontal lobe. This appears to reflect asymmetric prominence of the subarachnoid space rather than a subdural collection. MRA neck: 1. The right ICA is poorly delineated shortly beyond its origin, likely occluded or near occluded. 2. The left CCA is nonvisualized except for its very proximal portion. The cervical left ICA is also nonvisualized. These vessels are likely occluded or near occluded. 3. The dominant left vertebral artery is patent within the neck. Apparent moderate stenosis at the origin of this vessel. Flow related signal is seen intermittently within the non-dominant cervical right vertebral artery. MRA head: 1. 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 Reconstitution of the intracranial ICAs at the supraclinoid level. 2. Attenuated appearance of multiple proximal M2 left MCA branches. However, no left M2 proximal branch occlusion is identified. 3. Moderate stenosis within the A1 right ACA. Electronically Signed   By: Jackey Loge DO   On: 11/14/2020 23:10   DG Chest Portable 1 View  Result Date: 11/14/2020 CLINICAL DATA:  Pt states he woke up at 10:00 and his arms felt shaky. He went back to bed and woke up at 1400. He felt unbalanced and couldn't get out of bed and kept falling to his R sided. He was having a hard time with the coordination of his R arm and his L eye was blurry. Per [facility], they stated pt's hr went from 40 - 110. EXAM: PORTABLE CHEST 1 VIEW COMPARISON:  None. FINDINGS: Cardiac silhouette is normal in size. No mediastinal or hilar masses. There are bilateral coarsely thickened interstitial markings. Subtle hazy opacity also noted most evident in the right mid to lower lung. No convincing pleural effusion.  No pneumothorax. Skeletal structures are grossly intact.  IMPRESSION: 1. Coarsely thickened bilateral interstitial markings with some hazy type opacity most evident in the right mid to lower lung. These findings may all be chronic. Consider multifocal infection particularly on the right, in the proper clinical setting. Electronically Signed   By: Amie Portland M.D.   On: 11/14/2020 17:04   ECHOCARDIOGRAM COMPLETE BUBBLE STUDY  Result Date: 11/15/2020    ECHOCARDIOGRAM REPORT   Patient Name:   Martin Collins Date of Exam: 11/15/2020 Medical Rec #:  [id number]   Height:       75.0 in Accession #:    [id number]  Weight:       251.5 lb Date of Birth:  [birth year]  BSA:          2.419 m Patient Age:    86 years    BP:           175/6 mmHg Patient Gender: M           HR:           105 bpm. Exam Location:  Inpatient Procedure: 2D Echo, Cardiac Doppler, Color Doppler, Intracardiac Opacification            Agent and Saline Contrast Bubble Study Indications:    Stroke  History:        Patient has no prior history of Echocardiogram examinations.                 Elevated troponin.  Sonographer:    Sheralyn Boatman RDCS Referring Phys: [id number] Angie Fava  Sonographer Comments: Technically difficult study due to poor echo windows. Patient heart  rate erratic during exam. Patient moving during study. IMPRESSIONS  1. Left ventricular ejection fraction, by estimation, is 25-30%. The left ventricle has severely decreased function. The left ventricle demonstrates severe global hypokinesis with inferolateral akinesis. The left ventricular internal cavity size was mildly dilated. There is mild left ventricular hypertrophy. Left ventricular diastolic parameters are consistent with Grade II diastolic dysfunction (pseudonormalization).  2. Right ventricular systolic function is normal. The right ventricular size is normal. Tricuspid regurgitation signal is inadequate for assessing PA pressure.  3. Left atrial size was mildly dilated.  4. The mitral valve is degenerative. Mild mitral valve  regurgitation. No evidence of mitral stenosis. Moderate mitral annular calcification.  5. The aortic valve is tricuspid. Aortic valve regurgitation is not visualized. Mild aortic valve sclerosis is present, with no evidence of aortic valve stenosis.  6. Bubble study markedly positive but late after 8-9 beats. ?large pulmonary AVM.  7. The IVC was poorly visualized. FINDINGS  Left Ventricle: Left ventricular ejection fraction, by estimation, is 25 to 30%. The left ventricle has severely decreased function. The left ventricle demonstrates global hypokinesis. Definity contrast agent was given IV to delineate the left ventricular endocardial borders. The left ventricular internal cavity size was mildly dilated. There is mild left ventricular hypertrophy. Left ventricular diastolic parameters are consistent with Grade II diastolic dysfunction (pseudonormalization). Right Ventricle: The right ventricular size is normal. No increase in right ventricular wall thickness. Right ventricular systolic function is normal. Tricuspid regurgitation signal is inadequate for assessing PA pressure. Left Atrium: Left atrial size was mildly dilated. Right Atrium: Right atrial size was normal in size. Pericardium: There is no evidence of pericardial effusion. Mitral Valve: The mitral valve is degenerative in appearance. There is mild calcification of the mitral valve leaflet(s). Moderate mitral annular calcification. Mild mitral valve regurgitation. No evidence of mitral valve stenosis. Tricuspid Valve: The tricuspid valve is normal in structure. Tricuspid valve regurgitation is not demonstrated. Aortic Valve: The aortic valve is tricuspid. Aortic valve regurgitation is not visualized. Mild aortic valve sclerosis is present, with no evidence of aortic valve stenosis. Pulmonic Valve: The pulmonic valve was normal in structure. Pulmonic valve regurgitation is not visualized. Aorta: The aortic root is normal in size and structure. Venous:  The inferior vena cava was not well visualized. IAS/Shunts: Bubble study markedly positive but after 9 beats. ?large pulmonary AVM. Agitated saline contrast was given intravenously to evaluate for intracardiac shunting.  LEFT VENTRICLE PLAX 2D LVIDd:         5.95 cm LVIDs:         5.35 cm LV PW:         1.65 cm LV IVS:        1.60 cm LVOT diam:     2.80 cm LV SV:         153 LV SV Index:   63 LVOT Area:     6.16 cm  LV Volumes (MOD) LV vol d, MOD A2C: 193.5 ml LV vol d, MOD A4C: 218.5 ml LV vol s, MOD A2C: 117.4 ml LV vol s, MOD A4C: 173.0 ml LV SV MOD A2C:     76.1 ml LV SV MOD A4C:     218.5 ml LV SV MOD BP:      61.7 ml RIGHT VENTRICLE RV S prime:     11.60 cm/s TAPSE (M-mode): 1.8 cm LEFT ATRIUM           Index       RIGHT ATRIUM  Index LA diam:      4.70 cm 1.94 cm/m  RA Area:     13.10 cm LA Vol (A2C): 37.6 ml 15.54 ml/m RA Volume:   32.90 ml  13.60 ml/m LA Vol (A4C): 63.9 ml 26.42 ml/m  AORTIC VALVE LVOT Vmax:   97.50 cm/s LVOT Vmean:  80.500 cm/s LVOT VTI:    0.248 m  AORTA Ao Root diam: 3.70 cm MITRAL VALVE MV Area (PHT): 3.65 cm    SHUNTS MV Decel Time: 208 msec    Systemic VTI:  0.25 m MV E velocity: 96.00 cm/s  Systemic Diam: 2.80 cm MV A velocity: 76.85 cm/s MV E/A ratio:  1.25 Marca Ancona MD Electronically signed by Marca Ancona MD Signature Date/Time: 11/15/2020/4:55:51 PM    Final    CT HEAD CODE STROKE WO CONTRAST  Result Date: 11/14/2020 CLINICAL DATA:  Code stroke. Neuro deficit, acute, stroke suspected. Additional history provided: Left eye blurriness, right arm sensory and coordination. EXAM: CT HEAD WITHOUT CONTRAST TECHNIQUE: Contiguous axial images were obtained from the base of the skull through the vertex without intravenous contrast. COMPARISON:  No pertinent prior exams available for comparison. FINDINGS: Brain: Mild cerebral atrophy. Asymmetric extra-axial CSF density overlying the left cerebral hemisphere measuring up to 6 mm in thickness. This may be related to  patient head positioning at the time of examination. A chronic subdural hematoma or subdural hygroma cannot be excluded. 4 mm rightward midline shift. Again, this may be related to patient head positioning or mass effect from a left subdural collection. Mild ill-defined hypoattenuation within the cerebral white matter, nonspecific, but compatible with chronic small vessel ischemic disease. There is no acute intracranial hemorrhage. No demarcated cortical infarct. No evidence of intracranial mass. No midline shift. Vascular: No hyperdense vessel.  Atherosclerotic calcifications. Skull: Normal. Negative for fracture or focal lesion. Sinuses/Orbits: Visualized orbits show no acute finding. Moderate mucosal thickening within the right maxillary sinus with associated chronic reactive osteitis. Mild right frontal and bilateral ethmoid sinus mucosal thickening. ASPECTS [facility] Stroke Program Early CT Score) - Ganglionic level infarction (caudate, lentiform nuclei, internal capsule, insula, M1-M3 cortex): 7 - Supraganglionic infarction (M4-M6 cortex): 3 Total score (0-10 with 10 being normal): 10 These results were communicated to Dr. Otelia Limes At 4:31 pmon 2/13/2022by text page via the [facility] messaging system. IMPRESSION: No evidence of acute intracranial hemorrhage or acute infarct. ASPECTS is 10. Asymmetric extra-axial CSF density overlying the left cerebral hemisphere measuring up to 6 mm in thickness. This may be related to patient head positioning at the time of the examination. A chronic subdural hematoma or subdural hygroma cannot be excluded. 4 mm rightward midline shift. Again, this may be related to patient head positioning or mass effect from a left subdural collection. Mild cerebral atrophy and chronic small vessel ischemic disease. Paranasal sinus disease, most notably chronic right maxillary sinusitis. Electronically Signed   By: Jackey Loge DO   On: 11/14/2020 16:33   VAS US CAROTID (at [facility] and [facility]  only)  Result Date: 11/15/2020 Carotid Arterial Duplex Study Indications:       TIA, visual disturbance LT, weakness RT. Risk Factors:      Past history of smoking. Other Factors:     Limited medical history available- patient reports he has not                    seen physician in approximately 30 years. Limitations        Today's exam was limited due  to the patient's inability or                    unwillingness to cooperate, the high bifurcation of the                    carotid and the patient's respiratory variation. Comparison Study:  11-15-2020 CTA neck showed RT ICA occlusion, RT ECA stenosis,                    RT vertebral occlusion, LT CCA occlusion, and LT ICA                    occlusion. Performing Technologist: Jean Rosenthal RDMS  Examination Guidelines: A complete evaluation includes B-mode imaging, spectral Doppler, color Doppler, and power Doppler as needed of all accessible portions of each vessel. Bilateral testing is considered an integral part of a complete examination. Limited examinations for reoccurring indications may be performed as noted.  Right Carotid Findings: +----------+--------+--------+--------+------------------+---------------------+           PSV cm/sEDV cm/sStenosisPlaque DescriptionComments              +----------+--------+--------+--------+------------------+---------------------+ CCA Prox  101     11                                intimal thickening    +----------+--------+--------+--------+------------------+---------------------+ CCA Distal42      13                                intimal thickening    +----------+--------+--------+--------+------------------+---------------------+ ICA Prox                  Occluded                                        +----------+--------+--------+--------+------------------+---------------------+ ICA Mid                   Occluded                                         +----------+--------+--------+--------+------------------+---------------------+ ICA Distal                                          Not visualized        +----------+--------+--------+--------+------------------+---------------------+ ECA       600             >50%    heterogenous      Nyquist limit-                                                            velocities greater  than 600              +----------+--------+--------+--------+------------------+---------------------+ +----------+--------+-------+--------+-------------------+           PSV cm/sEDV cmsDescribeArm Pressure (mmHG) +----------+--------+-------+--------+-------------------+ [id number]            Stenotic                    +----------+--------+-------+--------+-------------------+ +---------+--------+--------+--------------+ VertebralPSV cm/sEDV cm/sNot identified +---------+--------+--------+--------------+  Left Carotid Findings: +----------+-------+--------+--------+----------------+-----------------------+           PSV    EDV cm/sStenosisPlaque          Comments                          cm/s                   Description                             +----------+-------+--------+--------+----------------+-----------------------+ CCA Prox  47                                     Near occlusion- slow                                                     flow                    +----------+-------+--------+--------+----------------+-----------------------+ CCA Mid   42                                     Near occlusion- slow                                                     flow                    +----------+-------+--------+--------+----------------+-----------------------+ CCA Distal               Occludedhypoechoic                               +----------+-------+--------+--------+----------------+-----------------------+ ICA Prox                 Occludedhyperechoic                             +----------+-------+--------+--------+----------------+-----------------------+ ICA Mid                  Occludedhyperechoic                             +----------+-------+--------+--------+----------------+-----------------------+ ICA Distal                                       Not visualized          +----------+-------+--------+--------+----------------+-----------------------+  ECA       137    28                                                      +----------+-------+--------+--------+----------------+-----------------------+ +----------+--------+--------+----------------+-------------------+           PSV cm/sEDV cm/sDescribe        Arm Pressure (mmHG) +----------+--------+--------+----------------+-------------------+ [id number]             Multiphasic, WNL                    +----------+--------+--------+----------------+-------------------+ +---------+--------+---+--------+--+---------+ VertebralPSV cm/s123EDV cm/s51Antegrade +---------+--------+---+--------+--+---------+   Summary: 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 The                ECA appears >50% stenosed. Left Carotid: Evidence consistent with near occlusion of the left ICA. The CCA               appears occluded. The ECA appears <50% stenosed. Vertebrals:  Left vertebral artery demonstrates antegrade flow. Right vertebral              artery was not visualized. Subclavians: Normal flow hemodynamics were seen in bilateral subclavian              arteries. *See table(s) above for measurements and observations.  Electronically signed by Delia Heady MD on 11/15/2020 at 12:18:20 PM.    Final         Scheduled Meds:  Continuous Infusions:   LOS: 1 day        Kathlen Mody, MD [facility]   To contact the attending provider between 7A-7P or the covering provider during after hours 7P-7A, please log into the web site [email or web address] and access using universal [place] password for that web site. If you do not have the password, please call the hospital operator.  11/16/2020, 1:24 PM

## 2020-11-16 NOTE — Consult Note (Signed)
Consultation Note Date: 11/16/2020   Patient Name: Martin Collins  DOB: 05-23-1934  MRN: [id number]  Age / Sex: 87 y.o., male   PCP: Patient, No Pcp Per Referring Physician: Kathlen Mody, MD   REASON FOR CONSULTATION:Establishing goals of care  Palliative Care consult requested for goals of care discussion in this 87 y.o. male with no known medical history other than GERD. Patient presented to ER from home due to blurry vision of left eye. It was noted patient reported awaking from nap with blurry vision, tremors of upper extremities, and weakness of right upper and lower extremities. He was unable to ambulate and required support to sit up which was new for him. Since admission has been evaluated and followed by Neurology in the setting of ischemic stroke. Patient with worsening weakness and now obtunded on 11/13/2020. Repeat CT scan now showing large  Left MCA infarct with midline shift.   Clinical Assessment and Goals of Care: I have reviewed medical records including lab results, imaging, Epic notes, and MAR, received report from the bedside RN. I spoke with patient's fiance, Jonette Pesa, daughter, Ellis Parents and son NESTA KIMPLE to discuss diagnosis prognosis, GOC, EOL wishes, disposition and options.  I introduced Palliative Medicine as specialized medical care for people living with serious illness. It focuses on providing relief from the symptoms and stress of a serious illness. The goal is to improve quality of life for both the patient and the family.  We discussed a brief life review of the patient, along with his functional and nutritional status. Bonita Quin and patient have been in a relationship for over 25 years. He has 3 children from a previous marriage all who live out of state ([place] ([place]), [place] ([place]), and [place] ([place] Patient is originally from [place]. He is a retired Tax inspector.   Prior to admission Fiance reports patient has not seen a regular provider in over 30 years and did not have  any major health concerns. She reports he was ambulatory without assistive devices, independent of all ADLs, good appetite, and still did handy work around the home.   We discussed His current illness and what it means in the larger context of His on-going co-morbidities. With specific discussions regarding recent stroke. Natural disease trajectory and expectations at EOL were discussed.  Family also acknowledges updates from Dr. Pearlean Brownie.   Bonita Quin is emotional sharing patient's many years of "beating the odds" after not seeking any medical care but with no medical concerns. She shares her emotional distress of loosing patient as she has recently loss her sister (who was her best friend) and her daughter. Emotional support provided.   I spoke with daughter Desma Mcgregor and son, PJ also. They both shared their sadness of their distant relationship with their father. Support provided.   I attempted to elicit values and goals of care important to the patient.    The difference between aggressive medical intervention and comfort care was considered in light of the patient's goals of care. I educated family on what comfort care measures would look like.   Family reports patient did not have an advanced directive however, have expressed on several occassions over many years that he would never want to be placed on life-prolonging measures or undergo heroic measures. Children and Fiance all mutually agree on DNR/DNI.   Family are clear that patient valued quality of live over quantity and would not want to live in his current state. They mutually express they are not interested  in further aggressive interventions and would like to focus on patient's comfort and end-of-life care. Support provided. Desma Mcgregor and PJ are arranging to fly in to be at the bedside with patient.   Questions and concerns were addressed. The family was encouraged to call with questions or concerns.  PMT will continue to support  holistically.   SOCIAL HISTORY:     reports that he quit smoking about 12 years ago. He has a 30.00 pack-year smoking history. He has never used smokeless tobacco. He reports current alcohol use of about 7.0 standard drinks of alcohol per week. He reports previous drug use.  CODE STATUS: DNR  ADVANCE DIRECTIVES: Primary Decision Maker:Next of Kin (Children)    SYMPTOM MANAGEMENT: see below   Palliative Prophylaxis:   Aspiration, Eye Care, Frequent Pain Assessment, Oral Care and Turn Reposition  PSYCHO-SOCIAL/SPIRITUAL:  Support System: Family  Desire for further Chaplaincy support:Yes   Additional Recommendations (Limitations, Scope, Preferences):  Full Comfort Care, No Artificial Feeding and DNR/DNI  Education on end-of-life.    PAST MEDICAL HISTORY: Past Medical History:  Diagnosis Date  . Acid reflux     ALLERGIES:  has No Known Allergies.   MEDICATIONS:  Current Facility-Administered Medications  Medication Dose Route Frequency Provider Last Rate Last Admin  . aspirin suppository 300 mg  300 mg Rectal Daily Olivencia-Simmons, Ivelisse, NP   300 mg at 11/16/20 0949  . enoxaparin (LOVENOX) injection 40 mg  40 mg Subcutaneous Q24H Olivencia-Simmons, Ivelisse, NP   40 mg at 11/15/20 2104    VITAL SIGNS: BP (!) 191/68 (BP Location: Left Arm)   Pulse (!) 53   Temp (!) 97.5 F (36.4 C) (Oral)   Resp 20   Ht 6\' 3"  (1.905 m)   Wt 111.9 kg   SpO2 93%   BMI 30.83 kg/m  Filed Weights   11/14/20 1600 11/14/20 1641 11/16/20 0500  Weight: 114.1 kg 114.1 kg 111.9 kg    Estimated body mass index is 30.83 kg/m as calculated from the following:   Height as of this encounter: 6\' 3"  (1.905 m).   Weight as of this encounter: 111.9 kg.  LABS: CBC:    Component Value Date/Time   WBC 9.4 11/15/2020 0414   HGB 12.9 (L) 11/15/2020 0414   HCT 39.3 11/15/2020 0414   PLT 149 (L) 11/15/2020 0414   Comprehensive Metabolic Panel:    Component Value Date/Time   NA 139  11/15/2020 0351   K 4.4 11/15/2020 0351   BUN 18 11/15/2020 0351   CREATININE 1.43 (H) 11/15/2020 0351   ALBUMIN 3.8 11/14/2020 1610     Review of Systems  Unable to perform ROS: Patient unresponsive    Prognosis: POOR (days)   Discharge Planning:  Anticipated Hospital Death  Recommendations: . DNR/DNI-as requested and confirmed by children 11/17/2020, 11/16/2020) and Desma Mcgregor.  . Transition all care to focus on comfort/EOL . Family is aware of anticipated hospital death. Children are flying in to be at the bedside. Unrestricted visitation in the setting of comfort.  Morphine PRN for pain/air hunger/comfort Robinul PRN for excessive secretions Ativan scheduled and PRN for agitation/anxiety Zofran PRN for nausea Liquifilm tears PRN for dry eyes Haldol PRN for agitation/anxiety Comfort cart for family Unrestricted visitations in the setting of EOL (per policy) Oxygen PRN 2L or less for comfort. No escalation.  Esperanza Sheets PMT will continue to support and follow as needed. Please call team line with urgent needs.   Palliative Performance Scale: PPS 10%  Family expressed understanding and was in agreement with this plan.   Thank you for allowing the Palliative Medicine Team to assist in the care of this patient.  Time In: 1200 Time Out: 1310 Time Total: 70 min.   Visit consisted of counseling and education dealing with the complex and emotionally intense issues of symptom management and palliative care in the setting of serious and potentially life-threatening illness.Greater than 50%  of this time was spent counseling and coordinating care related to the above assessment and plan.  Signed by:  Willette Alma, AGPCNP-BC Palliative Medicine Team  Phone: [phone] Pager: [phone] Amion: Thea Alken

## 2020-11-16 NOTE — Progress Notes (Signed)
STROKE TEAM PROGRESS NOTE   INTERVAL HISTORY His significant other is at the bedside.  She feels patient has clinically worsened this a.m.  He appears to be more aphasic with increased right hemiparesis.  He is quite obtunded and barely responsive in extension plegia.  Vital signs are stable.  Stat CT scan was obtained which shows a very large left MCA infarct with mild midline shift significantly worse from admission   Vitals:   11/15/20 2321 11/16/20 0358 11/16/20 0500 11/16/20 0820  BP: (!) 177/84 (!) 161/84  (!) 164/71  Pulse: (!) 51 61  (!) 55  Resp: 18 19  20   Temp: 98.3 F (36.8 C) 98.1 F (36.7 C)  98.2 F (36.8 C)  TempSrc:  Oral  Oral  SpO2: 92% 96%  98%  Weight:   111.9 kg   Height:       CBC:  Recent Labs  Lab 11/14/20 1610 11/14/20 1614 11/15/20 0414  WBC 8.4  --  9.4  NEUTROABS 5.0  --   --   HGB 15.2 16.0 12.9*  HCT 46.8 47.0 39.3  MCV 94.7  --  95.2  PLT 191  --  149*   Basic Metabolic Panel:  Recent Labs  Lab 11/14/20 1610 11/14/20 1614 11/15/20 0351  NA 140 140 139  K 4.1 4.0 4.4  CL 105 107 108  CO2 23  --  19*  GLUCOSE 123* 112* 98  BUN 20 23 18   CREATININE 1.63* 1.50* 1.43*  CALCIUM 9.3  --  9.1  MG  --   --  2.0   **Lipid Panel:  Recent Labs  Lab 11/15/20 0351  CHOL 156  TRIG 58  HDL 52  CHOLHDL 3.0  VLDL 12  LDLCALC 92   HgbA1c:  Recent Labs  Lab 11/15/20 0414  HGBA1C 4.8   Urine Drug Screen: Pending Alcohol Level No results for input(s): ETH in the last 168 hours.  IMAGING past 24 hours  11/16/2020 CT head: 1.  Interval development of a large acute left MCA territory infarct. Mild associated mass effect at this time with only subtle partial effacement of the left lateral ventricle. No evidence of hemorrhagic conversion. 2.  There is now hyperdensity of the M1 left middle cerebral artery, likely reflecting interval occlusion of this vessel.   CT ANGIO HEAD W OR WO CONTRAST  Result Date: 11/15/2020 CLINICAL  DATA:  Bilateral ICA occlusion show by MRI. Small left hemisphere infarctions. EXAM: CT ANGIOGRAPHY HEAD AND NECK TECHNIQUE:  IMPRESSION:  1. Atherosclerotic disease of the aortic arch.  2. 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 Reconstituted flow in the left external carotid artery. No flow in the left cervical internal carotid artery.  3. 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 Severe stenosis of the right external carotid artery.  4. Occlusion of the right vertebral artery at its origin but reconstituted as a small vessel by cervical collaterals.  5. No intracranial large or medium vessel occlusion or correctable proximal stenosis.  6. Mediastinal adenopathy, including a necrotic appearing node at the lower right paratracheal region measuring 2.5 cm. Complete chest evaluation suggested to rule out malignancy.  7. Emphysema and scarring. Fluid in the major fissure on the right or some consolidation of the dorsal aspect of the inferior right upper lobe.  8. Emphysema and aortic atherosclerosis. Aortic Atherosclerosis (ICD10-I70.0) and Emphysema (ICD10-J43.9). Electronically Signed   By: 11/18/2020 M.D.   On: 11/15/2020 00:37  CT CHEST WO CONTRAST  Result Date: 11/15/2020 CLINICAL DATA:  Lung nodule. EXAM: CT CHEST WITHOUT CONTRAST TECHNIQUE: IMPRESSION:  1. 7 mm nodule is noted in the left lung apex. Non-contrast chest CT at 6-12 months is recommended. If the nodule is stable at time of repeat CT, then future CT at 18-24 months (from today's scan) is considered optional for low-risk patients, but is recommended for high-risk patients. This recommendation follows the consensus statement: Guidelines for Management of Incidental Pulmonary Nodules Detected on CT Images: From the Fleischner Society 2017; Radiology 2017; 284:228-243.  2. Interstitial densities are noted throughout both lungs which may represent pulmonary edema, although  atypical infection or scarring cannot be excluded.  3. Minimal bilateral pleural effusions are noted.  4. Enlarged mediastinal adenopathy is noted which may be inflammatory or neoplastic in etiology. Clinical correlation is recommended.  5. Coronary artery calcifications are noted.  6. Cholelithiasis.  7. Nonobstructive right renal calculus.  8. Emphysema and aortic atherosclerosis. Aortic Atherosclerosis (ICD10-I70.0) and Emphysema (ICD10-J43.9). Electronically Signed   By: Lupita RaiderJames  Green Jr M.D.   On: 11/15/2020 09:55   MR ANGIO HEAD WO CONTRAST  Result Date: 11/14/2020 CLINICAL DATA:  Neuro deficit, acute, stroke suspected. EXAM: MR HEAD WITHOUT CONTRAST MR CIRCLE OF WILLIS WITHOUT CONTRAST MRA OF THE NECK WITHOUT CONTRAST  IMPRESSION: MRI brain:  1. Intermittently motion degraded examination.  2. Small acute infarcts within the left parietal cortex and left frontoparietal white matter  3. Background mild-to-moderate cerebral atrophy and mild chronic small vessel ischemic disease.  4. Tiny chronic right cerebellar infarct.  5. Redemonstrated asymmetric CSF intensity prominence overlying the left frontal lobe. This appears to reflect asymmetric prominence of the subarachnoid space rather than a subdural collection.   MRA neck:  1. The right ICA is poorly delineated shortly beyond its origin, likely occluded or near occluded.  2. The left CCA is nonvisualized except for its very proximal portion. The cervical left ICA is also nonvisualized. These vessels are likely occluded or near occluded.  3. The dominant left vertebral artery is patent within the neck. Apparent moderate stenosis at the origin of this vessel. Flow related signal is seen intermittently within the non-dominant cervical right vertebral artery.   MRA head:  1. 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 Reconstitution of the intracranial ICAs at the supraclinoid  level.  2. Attenuated appearance of multiple proximal M2 left MCA branches. However, no left M2 proximal branch occlusion is identified.  3. Moderate stenosis within the A1 right ACA. Electronically Signed   By: Jackey LogeKyle  Golden DO   On: 11/14/2020 23:10   DG Chest Portable 1 View  Result Date: 11/14/2020  IMPRESSION: 1. Coarsely thickened bilateral interstitial markings with some hazy type opacity most evident in the right mid to lower lung. These findings may all be chronic. Consider multifocal infection particularly on the right, in the proper clinical setting. Electronically Signed   By: Amie Portlandavid  Ormond M.D.   On: 11/14/2020 17:04   CT HEAD CODE STROKE WO CONTRAST  Result Date: 11/14/2020 CLINICAL DATA:  Code stroke.  IMPRESSION:  1. No evidence of acute intracranial hemorrhage or acute infarct. ASPECTS is 10. Asymmetric extra-axial CSF density overlying the left cerebral hemisphere measuring up to 6 mm in thickness. This may be related to patient head positioning at the time of the examination. A chronic subdural hematoma or subdural hygroma cannot be excluded. 4 mm rightward midline shift. Again, this may be  related to patient head positioning or mass effect from a left subdural collection. Mild cerebral atrophy and chronic small vessel ischemic disease. Paranasal sinus disease, most notably chronic right maxillary sinusitis. Electronically Signed   By: Jackey Loge DO   On: 11/14/2020 16:33   VAS US CAROTID (at [facility] and [facility] only)  Result Date: 11/15/2020 Summary:  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 The ECA appears >50% stenosed.  Left Carotid: Evidence consistent with near occlusion of the left ICA. The CCA appears occluded. The ECA appears <50% stenosed. Vertebrals:  Left vertebral artery demonstrates antegrade flow. Right vertebral artery was not visualized. Subclavians: Normal flow hemodynamics were seen in bilateral subclavian arteries.Electronically  signed by Delia Heady MD on 11/15/2020 at 12:18:20 PM.    Final     PHYSICAL EXAM Elderly Caucasian male who is not in distress. Afebrile. Head is nontraumatic. Neck is supple without bruit.    Cardiac exam no murmur or gallop. Lungs are clear to auscultation. Distal pulses are well felt.  Neurological Exam :  Patient is stuporous.  He can barely be aroused and opens eyes partially but would not sustain.  Remains aphasic.  He is unable to follow simple commands. Review guttural sounds he has left gaze preference and is unable to look to the right.  Blinks to threat more on the left than the right.  Right lower facial weakness.  Does not stick out tongue.  Motor system exam shows dense right hemiparesis.  LUE withdraws slightly to pain.    ASSESSMENT/PLAN Mr. Martin Collins is a 87 y.o. male with no known history (not been to the doctor for over 25 years) presenting with acute onset of left monocular visual blurring, BUE tremor, falling to the right while seated and right sided incoordination..    Stroke:  left small acute infarcts in the left parietal cortex and left frontoparietal white matter likely secondary to bilateral carotid artery occlusion which appears chronic with artery to artery embolism or failure of collaterals.  Code Stroke: CT head No acute abnormality. ASPECTS 10.    Not a tPA candidate as he was out of the time window  CTA head & neck:   left common carotid artery cclusion immediately beyond the origin. Reconstituted flow in the left external carotid artery. No flow in the left cervical internal carotid artery.   right ICA occlusion at the bifurcation. Severe stenosis of the right external carotid artery.   right vertebral artery occlusion at its origin but reconstituted as a small vessel by cervical collaterals.   No intracranial large vessel occlusion   MRI: Small acute infarcts within the left parietal cortex and left frontoparietal  MRA neck: right ICA occlusion,  left CCA occlusion, and left ICA occlusion   MRA head:  intracranial internal carotid arteries remain occluded or near occluded throughout the siphon region  Carotid Doppler: total occlusion of the right ICA and near occlusion of the left ICA  CT head (11/16/2020): large acute left MCA territory infarct. Mild associated mass effect at this time with only subtle partial effacement of the left lateral ventricle  2D Echo: 25-30%, decrease left ventricular function, grade II diastolic dysfunction, large pulm AVM  CT angio chest to evaluate for pulmonary AVM  LDL 92  HgbA1c 4.8  VTE prophylaxis -SCDs  No antithrombotic prior to admission, now on aspirin 325 mg daily. Change to Aspirin 81mg  and Plavix 75 for 3 months then continue with aspirin alone  Therapy  recommendations:  Pending, OT: CIR  Disposition:  Pending  Right ICA, Left ICA occlusion, left CCA occlusion - MRA right ICA occlusion, left CCA occlusion, and left ICA occlusion - Spoke with [facility] and review imaging with them yesterday.  Likely chronic and not a candidate for endovascular revascularization at present time. - On DAPT with ASA 81 mg + Plavix 75 mg  Hypertension  Home meds:  none . Allow for permissive hypertension for cerbral perfusion  . Long-term BP goal normotensive  Hyperlipidemia  Home meds:  none,   LDL 92, goal < 70  Add atorvastatin 80   Continue statin at discharge  Diabetes type II Controlled  Home meds: none  HgbA1c 4.8, goal < 7.0  CBGs Recent Labs    11/14/20 1608 11/16/20 1011  GLUCAP 116* 94      SSI  Other Stroke Risk Factors  Advanced Age >/= 1   Cigarette smoker (former)  ETOH use, drinks 1 glass of wine per day advised to drink no more than 1-2 drink(s) a day Obesity, Body mass index is 30.83 kg/m., BMI >/= 30 associated with increased stroke risk, recommend weight loss, diet and exercise as appropriate   Other Active Problems  Mediastinal adenopathy inclining  nodule in the lower right paratracheal region measuring 2.5  Will need outpatient follow-up  Troponenemia  Likely due to demand ischemia  Cardiology consulted  Plan for TTE with bubble  30 day outpatient monitor to assess for atrial fibrillation given his acute CVA and possible embolic source  Acute possible on Chronic kidney Disease  Creat level 1.43, unclear baseline  Follow level in am  Continue IVF  Hospital day # 1  Lissy Olivencia-Simmons, ACNP-BC Stroke Neurology Nurse Practitioner 11/16/2020 2:30pm  Patient clearly has had neurological worsening and appears more aphasic with increased dense hemiplegia with decreased level of consciousness.  CT scan of the head shows extension of his large left MCA stroke with cytotoxic edema midline shift.  Patient prognosis is quite poor respiratory likely need intubation for respiratory failure and aggressive medical care.  I spoke to the patient's fianc the bedside and subsequently called his daughter and spoke to her on the phone.  Briefly patient would not want aggressive care and agreed to DNR and comfort care measures only.  I have informed patient's primary attending Dr. Blake Divine is also in agreement with plan.  Patient will transition to comfort care measures only.  Stroke team will sign off.  Kindly call for questions I have spent a total of   35 minutes with the patient reviewing hospital notes,  test results, labs and examining the patient as well as establishing an assessment and plan that was discussed personally with the patient.  > 50% of time was spent in direct patient care.    Delia Heady, MD  To contact Stroke Continuity provider, please refer to [email or web address]. After hours, contact General Neurology

## 2020-11-16 NOTE — Plan of Care (Signed)
  Problem: Education: Goal: Knowledge of secondary prevention will improve Outcome: Progressing Goal: Knowledge of patient specific risk factors addressed and post discharge goals established will improve Outcome: Progressing   Problem: Coping: Goal: Will verbalize positive feelings about self Outcome: Progressing Goal: Will identify appropriate support needs Outcome: Progressing   Problem: Self-Care: Goal: Ability to participate in self-care as condition permits will improve Outcome: Progressing   Problem: Nutrition: Goal: Risk of aspiration will decrease Outcome: Progressing Goal: Dietary intake will improve Outcome: Progressing   Problem: Nutrition: Goal: Risk of aspiration will decrease Outcome: Progressing Goal: Dietary intake will improve Outcome: Progressing

## 2020-11-16 NOTE — Progress Notes (Signed)
Attempted but patient headed to CT, tech will attempt again schedule permiting

## 2020-11-17 DIAGNOSIS — R778 Other specified abnormalities of plasma proteins: Secondary | ICD-10-CM | POA: Diagnosis not present

## 2020-11-17 DIAGNOSIS — K219 Gastro-esophageal reflux disease without esophagitis: Secondary | ICD-10-CM | POA: Diagnosis not present

## 2020-11-17 NOTE — TOC Transition Note (Signed)
Transition of Care [facility]) - CM/SW Discharge Note   Patient Details  Name: Martin Collins MRN: [id number] Date of Birth: 12-26-1933  Transition of Care [facility]) CM/SW Contact:  Baldemar Lenis, LCSW Phone Number: 11/17/2020, 3:47 PM   Clinical Narrative:   Nurse to call report to [phone]    Final next level of care: Hospice Medical Facility Barriers to Discharge: Barriers Resolved   Patient Goals and CMS Choice Patient states their goals for this hospitalization and ongoing recovery are:: patient unable to participate in goal setting, disoriented CMS Medicare.gov Compare Post Acute Care list provided to:: Patient Represenative (must comment) Choice offered to / list presented to : Adult Children  Discharge Placement                Patient to be transferred to facility by: PTAR Name of family member notified: Keri Patient and family notified of of transfer: 11/17/20  Discharge Plan and Services     Post Acute Care Choice: Hospice                               Social Determinants of Health (SDOH) Interventions     Readmission Risk Interventions No flowsheet data found.

## 2020-11-17 NOTE — Progress Notes (Signed)
Civil engineer, contracting [facility]) Hospital Liaison note.    Received request from [facility] manager for family interest in [facility]. Chart reviewed and eligibility confirmed. Spoke with family to confirm interest and explain services. Family agreeable to transfer today. TOC aware.    Registration paperwork has been completed. Please arrange transport.   RN please call report to [phone].  Please be sure the signed [facility] DNR form transports with the patient.  Thank you for the opportunity to participate in this patient's care.  Gillian Scarce, BSN, RN [facility] Liaison (listed on [place] [address] under Hospice/[facility])    [phone] (24h on call)

## 2020-11-17 NOTE — TOC Initial Note (Signed)
Transition of Care [facility]) - Initial/Assessment Note    Patient Details  Name: Martin Collins MRN: [id number] Date of Birth: 1934/08/22  Transition of Care [facility]) CM/SW Contact:    Geralynn Ochs, LCSW Phone Number: 11/17/2020, 1:34 PM  Clinical Narrative:    CSW alerted by MD that patient will be appropriate for hospice placement. CSW then met with family at bedside, discussed sending referral to [facility] for review. Daughter Kristin Bruins will be the main point of contact [phone]). CSW then contacted [facility] to provide referral. They will review and get back to CSW if there's beds available. CSW to follow.               Expected Discharge Plan: [place] Barriers to Discharge: Continued Medical Work up,Hospice Bed not available   Patient Goals and CMS Choice Patient states their goals for this hospitalization and ongoing recovery are:: patient unable to participate in goal setting, disoriented CMS Medicare.gov Compare Post Acute Care list provided to:: Patient Represenative (must comment) Choice offered to / list presented to : Adult Children  Expected Discharge Plan and Services Expected Discharge Plan: [place]     Post Acute Care Choice: Hospice Living arrangements for the past 2 months: Single Family Home                                      Prior Living Arrangements/Services Living arrangements for the past 2 months: Single Family Home Lives with:: Domestic Partner Patient language and need for interpreter reviewed:: No Do you feel safe going back to the place where you live?: Yes      Need for Family Participation in Patient Care: Yes (Comment) Care giver support system in place?: No (comment)   Criminal Activity/Legal Involvement Pertinent to Current Situation/Hospitalization: No - Comment as needed  Activities of Daily Living Home Assistive Devices/Equipment: None ADL Screening (condition at time of  admission) Patient's cognitive ability adequate to safely complete daily activities?: Yes Is the patient deaf or have difficulty hearing?: No Does the patient have difficulty seeing, even when wearing glasses/contacts?: No Does the patient have difficulty concentrating, remembering, or making decisions?: No Patient able to express need for assistance with ADLs?: Yes Does the patient have difficulty dressing or bathing?: No Independently performs ADLs?: Yes (appropriate for developmental age) Does the patient have difficulty walking or climbing stairs?: Yes Weakness of Legs: Right Weakness of Arms/Hands: Right  Permission Sought/Granted Permission sought to share information with : Facility Retail banker granted to share information with : Yes, Verbal Permission Granted  Share Information with NAME: Estevan Ryder  Permission granted to share info w AGENCY: Hospice  Permission granted to share info w Relationship: Daughter, Fiance     Emotional Assessment   Attitude/Demeanor/Rapport: Unable to Assess Affect (typically observed): Unable to Assess        Admission diagnosis:  Stroke [facility]) [I63.9] Elevated troponin [R77.8] Stroke-like symptoms [R29.90] Patient Active Problem List   Diagnosis Date Noted  . 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 Stroke-like symptoms   . Stroke ([place]) 11/14/2020  . Elevated troponin 11/14/2020  . Elevated serum creatinine 11/14/2020  . Acid reflux    PCP:  Patient, No Pcp Per Pharmacy:   [facility] - [place], [place] - [address] [place] [address] Phone: [phone] Fax: [phone]  Social Determinants of Health (SDOH) Interventions    Readmission Risk Interventions No flowsheet data found.

## 2020-11-17 NOTE — Discharge Summary (Addendum)
Physician Discharge Summary  Martin Collins ZOX:[id number] DOB: 05/25/1934 DOA: 11/14/2020  PCP: Patient, No Pcp Per  Admit date: 11/14/2020 Discharge date: 11/17/2020  Admitted From: home Disposition:  Hospice home     Discharge Condition:  stable   CODE STATUS:  DNR   Consultations:  Neurology  Cardiology  Palliative care    Discharge Diagnoses:  Principal Problem:   Stroke [facility]) Active Problems:   Elevated troponin   Acid reflux    Brief Summary: 87 year old gentleman prior history of GERD past tobacco abuse initially presents to [facility] on 11/14/2020 with suspected acute ischemic stroke.  Patient had an acute onset of left monocular visual blurring, bilateral upper extremity tremors and falling to the right.  He was brought to ED as a code stroke and was seen by neurology.  He was deemed not a candidate for TPA as he was out of the time window.     Hospital Course:  Acute infarcts -MRI revealed small acute infarcts in the left parietal cortex in the left frontoparietal area- recommended to have a TEE with bubble study - CT >Occlusion of the right ICA at the bifurcation. Severe stenosis of the right external carotid artery. Occlusion of left common carotid artery just beyond the origin, occlusion of the right vertebral artery - 2/15- ECHO: EF 25-30% global LV hypokinesis and a positive bubble study - on 2/15 he worsened clinically and became more aphasic with increased right sided hemiparesis- CT head revealed a left MCA territory infarct. Mild associated mass effect at this time with only subtle partial effacement of the left lateral ventricle.  - Transitioned to [facility] on 2/15 - today, he remains unresponsive- he appears to be having apneic episodes but his significant other states this is his normal breathing pattern when he sleeps - I have spoken with his Fiance, son and daughter today- they would like him to be transitioned to [facility] place where there is a bed  today  Troponin elevation - Troponin 292> 300> 248   Discharge Exam: Vitals:   11/17/20 0019 11/17/20 1126  BP: (!) 155/73 (!) 177/75  Pulse: 64 (!) 52  Resp: (!) 22 (!) 21  Temp: 99.6 F (37.6 C) 99.8 F (37.7 C)  SpO2: 100% 95%   Vitals:   11/16/20 1529 11/16/20 1940 11/17/20 0019 11/17/20 1126  BP: (!) 148/83 (!) 173/80 (!) 155/73 (!) 177/75  Pulse: (!) 58 70 64 (!) 52  Resp: 20 (!) 24 (!) 22 (!) 21  Temp: 99 F (37.2 C)  99.6 F (37.6 C) 99.8 F (37.7 C)  TempSrc: Oral  Oral Axillary  SpO2: 100% 95% 100% 95%  Weight:      Height:        General: Pt is alert, awake, not in acute distress Cardiovascular: RRR, S1/S2 +, no rubs, no gallops Respiratory: CTA bilaterally, no wheezing, no rhonchi Abdominal: Soft, NT, ND, bowel sounds + Extremities: no edema, no cyanosis   Discharge Instructions  Discharge Instructions    Diet - low sodium heart healthy   Complete by: As directed      Allergies as of 11/17/2020   No Known Allergies     Medication List    STOP taking these medications   bismuth subsalicylate 262 MG chewable tablet Commonly known as: PEPTO BISMOL   calcium carbonate 500 MG chewable tablet Commonly known as: TUMS - dosed in mg elemental calcium   Centrum Silver Adult 50+ Tabs   dextromethorphan-guaiFENesin 30-600 MG 12hr tablet Commonly  known as: MUCINEX DM   ibuprofen 200 MG tablet Commonly known as: ADVIL   NexIUM 24HR 20 MG capsule Generic drug: esomeprazole       No Known Allergies    CT ANGIO HEAD W OR WO CONTRAST  Result Date: 11/15/2020 CLINICAL DATA:  Bilateral ICA occlusion show by MRI. Small left hemisphere infarctions. EXAM: CT ANGIOGRAPHY HEAD AND NECK TECHNIQUE: Multidetector CT imaging of the head and neck was performed using the standard protocol during bolus administration of intravenous contrast. Multiplanar CT image reconstructions and MIPs were obtained to evaluate the vascular anatomy. Carotid stenosis  measurements (when applicable) are obtained utilizing NASCET criteria, using the distal internal carotid diameter as the denominator. CONTRAST:  60mL OMNIPAQUE IOHEXOL 350 MG/ML SOLN COMPARISON:  None. FINDINGS: CTA NECK FINDINGS Aortic arch: Aortic atherosclerosis.  Branching pattern is normal. Right carotid system: Pronounced atherosclerotic change of the innominate artery. Right common carotid artery shows intimal thickening but is patent to the bifurcation. Severe soft plaque at the bifurcation with occlusion of the ICA at its origin. Severe stenosis of the proximal right ECA. No reconstitution in the neck. Left carotid system: Common carotid artery occluded immediately beyond the origin. No flow seen beyond that to the region of the bifurcation. There is some reconstituted flow in the external carotid artery. No flow in the cervical internal carotid artery. Vertebral arteries: Left vertebral artery origin is widely patent. The left vertebral artery is patent through the cervical region to the foramen magnum. The right vertebral artery is occluded at its origin but reconstituted as a small vessel by cervical collaterals. Some antegrade flow occurs to the level of the foramen magnum. Skeleton: Ordinary cervical spondylosis. Other neck: No neck mass or neck lymphadenopathy. Upper chest: Patient has mediastinal adenopathy in the Peri carinal region and right paratracheal region including a necrotic appearing node measuring 2.5 cm at the lower right paratracheal region. The lungs show emphysema and scarring. There is either fluid in the major fissure on the right or some consolidation of the dorsal aspect of the inferior right upper lobe. Complete chest evaluation suggested to rule out malignancy. Review of the MIP images confirms the above findings CTA HEAD FINDINGS Anterior circulation: No ICA flow at the skull base. Reconstitution in the distal siphon regions by external to internal collaterals. Supraclinoid  internal carotid arteries are widely patent. Both anterior and middle cerebral vessels show flow. No large or medium vessel occlusion seen. Posterior circulation: Both vertebral arteries are patent through the foramen magnum to the basilar. There is some atherosclerotic disease of the vertebral artery V4 segments but no stenosis greater than 30%. Basilar artery shows mild atherosclerotic change but no flow limiting stenosis. Superior cerebellar and posterior cerebral arteries show flow. There is primary fetal origin of the right PCA. Venous sinuses: Patent and normal. Anatomic variants: None significant. Review of the MIP images confirms the above findings IMPRESSION: 1. Atherosclerotic disease of the aortic arch. 2. 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 Reconstituted flow in the left external carotid artery. No flow in the left cervical internal carotid artery. 3. 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 Severe stenosis of the right external carotid artery. 4. Occlusion of the right vertebral artery at its origin but reconstituted as a small vessel by cervical collaterals. 5. No intracranial large or medium vessel occlusion or correctable proximal stenosis. 6. Mediastinal adenopathy, including a necrotic appearing node at the lower right paratracheal region measuring 2.5 cm.  Complete chest evaluation suggested to rule out malignancy. 7. Emphysema and scarring. Fluid in the major fissure on the right or some consolidation of the dorsal aspect of the inferior right upper lobe. 8. Emphysema and aortic atherosclerosis. Aortic Atherosclerosis (ICD10-I70.0) and Emphysema (ICD10-J43.9). Electronically Signed   By: Paulina FusiMark  Shogry M.D.   On: 11/15/2020 00:37   CT HEAD WO CONTRAST  Addendum Date: 11/16/2020   ADDENDUM REPORT: 11/16/2020 13:16 ADDENDUM: These results were called by telephone at the time of interpretation on 11/16/2020 at 1:05 pm to provider  [facility]ELISSE OLIVENC[facility]-SIMMONS , who verbally acknowledged these results. Electronically Signed   By: Jackey LogeKyle  Golden DO   On: 11/16/2020 13:16   Result Date: 11/16/2020 CLINICAL DATA:  Stroke, follow-up worsening exam. EXAM: CT HEAD WITHOUT CONTRAST TECHNIQUE: Contiguous axial images were obtained from the base of the skull through the vertex without intravenous contrast. COMPARISON:  CT angiogram head/neck 11/15/2020. MRI brain 11/14/2020. MRA head/neck 11/14/2020. FINDINGS: Brain: Mild cerebral atrophy. New from the prior brain MRI of 11/14/2020, there is a large acute cortical and subcortical left MCA territory infarct within the left frontal and parietal lobes, left insula and subinsular region as well as left basal ganglia. Mild regional mass effect with only subtle partial effacement of the left lateral ventricle. No evidence of hemorrhagic conversion. Background mild ill-defined hypoattenuation within the cerebral white matter is nonspecific, but compatible with chronic small vessel ischemic disease. Unchanged asymmetric CSF intensity prominence overlying the left frontal lobe, which appear to reflect asymmetric prominence of the subarachnoid space on the prior brain MRI. Vascular: There is now hyperdensity of the M1 left middle cerebral artery, likely reflecting interval occlusion of this vessel (series 4, image 19). Skull: Normal. Negative for fracture or focal lesion. Sinuses/Orbits: Visualized orbits show no acute finding. Moderate right maxillary sinus mucosal thickening with associated chronic reactive osteitis. IMPRESSION: Interval development of a large acute left MCA territory infarct. Mild associated mass effect at this time with only subtle partial effacement of the left lateral ventricle. No evidence of hemorrhagic conversion. There is now hyperdensity of the M1 left middle cerebral artery, likely reflecting interval occlusion of this vessel. Stable background cerebral atrophy and chronic small vessel  ischemic disease. Electronically Signed: By: Jackey LogeKyle  Golden DO On: 11/16/2020 13:02   CT ANGIO NECK W OR WO CONTRAST  Result Date: 11/15/2020 CLINICAL DATA:  Bilateral ICA occlusion show by MRI. Small left hemisphere infarctions. EXAM: CT ANGIOGRAPHY HEAD AND NECK TECHNIQUE: Multidetector CT imaging of the head and neck was performed using the standard protocol during bolus administration of intravenous contrast. Multiplanar CT image reconstructions and MIPs were obtained to evaluate the vascular anatomy. Carotid stenosis measurements (when applicable) are obtained utilizing NASCET criteria, using the distal internal carotid diameter as the denominator. CONTRAST:  60mL OMNIPAQUE IOHEXOL 350 MG/ML SOLN COMPARISON:  None. FINDINGS: CTA NECK FINDINGS Aortic arch: Aortic atherosclerosis.  Branching pattern is normal. Right carotid system: Pronounced atherosclerotic change of the innominate artery. Right common carotid artery shows intimal thickening but is patent to the bifurcation. Severe soft plaque at the bifurcation with occlusion of the ICA at its origin. Severe stenosis of the proximal right ECA. No reconstitution in the neck. Left carotid system: Common carotid artery occluded immediately beyond the origin. No flow seen beyond that to the region of the bifurcation. There is some reconstituted flow in the external carotid artery. No flow in the cervical internal carotid artery. Vertebral arteries: Left vertebral artery origin is widely patent. The left vertebral  artery is patent through the cervical region to the foramen magnum. The right vertebral artery is occluded at its origin but reconstituted as a small vessel by cervical collaterals. Some antegrade flow occurs to the level of the foramen magnum. Skeleton: Ordinary cervical spondylosis. Other neck: No neck mass or neck lymphadenopathy. Upper chest: Patient has mediastinal adenopathy in the Peri carinal region and right paratracheal region including a  necrotic appearing node measuring 2.5 cm at the lower right paratracheal region. The lungs show emphysema and scarring. There is either fluid in the major fissure on the right or some consolidation of the dorsal aspect of the inferior right upper lobe. Complete chest evaluation suggested to rule out malignancy. Review of the MIP images confirms the above findings CTA HEAD FINDINGS Anterior circulation: No ICA flow at the skull base. Reconstitution in the distal siphon regions by external to internal collaterals. Supraclinoid internal carotid arteries are widely patent. Both anterior and middle cerebral vessels show flow. No large or medium vessel occlusion seen. Posterior circulation: Both vertebral arteries are patent through the foramen magnum to the basilar. There is some atherosclerotic disease of the vertebral artery V4 segments but no stenosis greater than 30%. Basilar artery shows mild atherosclerotic change but no flow limiting stenosis. Superior cerebellar and posterior cerebral arteries show flow. There is primary fetal origin of the right PCA. Venous sinuses: Patent and normal. Anatomic variants: None significant. Review of the MIP images confirms the above findings IMPRESSION: 1. Atherosclerotic disease of the aortic arch. 2. 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 Reconstituted flow in the left external carotid artery. No flow in the left cervical internal carotid artery. 3. 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 Severe stenosis of the right external carotid artery. 4. Occlusion of the right vertebral artery at its origin but reconstituted as a small vessel by cervical collaterals. 5. No intracranial large or medium vessel occlusion or correctable proximal stenosis. 6. Mediastinal adenopathy, including a necrotic appearing node at the lower right paratracheal region measuring 2.5 cm. Complete chest evaluation suggested to rule out  malignancy. 7. Emphysema and scarring. Fluid in the major fissure on the right or some consolidation of the dorsal aspect of the inferior right upper lobe. 8. Emphysema and aortic atherosclerosis. Aortic Atherosclerosis (ICD10-I70.0) and Emphysema (ICD10-J43.9). Electronically Signed   By: Paulina Fusi M.D.   On: 11/15/2020 00:37   CT CHEST WO CONTRAST  Result Date: 11/15/2020 CLINICAL DATA:  Lung nodule. EXAM: CT CHEST WITHOUT CONTRAST TECHNIQUE: Multidetector CT imaging of the chest was performed following the standard protocol without IV contrast. COMPARISON:  November 14, 2020. FINDINGS: Cardiovascular: Atherosclerosis of thoracic aorta is noted without aneurysm formation. Mild cardiomegaly is noted. No pericardial effusion is noted. Coronary artery calcifications are noted. Mediastinum/Nodes: Thyroid gland and esophagus are unremarkable. 1.9 cm right paratracheal lymph node is noted. 2 cm precarinal lymph node is noted. 12 mm AP window lymph node is noted. Lungs/Pleura: No pneumothorax is noted. Minimal pleural effusions are noted. Emphysematous disease is noted in both upper lobes. Interstitial densities are noted throughout both lungs which may represent pulmonary edema, although atypical infection or scarring cannot be excluded. 7 mm nodule is noted in the left lung apex best seen on image number 32 of series 7. Upper Abdomen: Cholelithiasis is noted. Nonobstructive right renal calculus is noted. Musculoskeletal: No chest wall mass or suspicious bone lesions identified. IMPRESSION: 1. 7 mm nodule is noted in the left lung apex.  Non-contrast chest CT at 6-12 months is recommended. If the nodule is stable at time of repeat CT, then future CT at 18-24 months (from today's scan) is considered optional for low-risk patients, but is recommended for high-risk patients. This recommendation follows the consensus statement: Guidelines for Management of Incidental Pulmonary Nodules Detected on CT Images: From the  Fleischner Society 2017; Radiology 2017; 284:228-243. 2. Interstitial densities are noted throughout both lungs which may represent pulmonary edema, although atypical infection or scarring cannot be excluded. 3. Minimal bilateral pleural effusions are noted. 4. Enlarged mediastinal adenopathy is noted which may be inflammatory or neoplastic in etiology. Clinical correlation is recommended. 5. Coronary artery calcifications are noted. 6. Cholelithiasis. 7. Nonobstructive right renal calculus. 8. Emphysema and aortic atherosclerosis. Aortic Atherosclerosis (ICD10-I70.0) and Emphysema (ICD10-J43.9). Electronically Signed   By: Lupita Raider M.D.   On: 11/15/2020 09:55   MR ANGIO HEAD WO CONTRAST  Result Date: 11/14/2020 CLINICAL DATA:  Neuro deficit, acute, stroke suspected. EXAM: MR HEAD WITHOUT CONTRAST MR CIRCLE OF WILLIS WITHOUT CONTRAST MRA OF THE NECK WITHOUT CONTRAST TECHNIQUE: Multiplanar, multiecho pulse sequences of the brain, circle of willis and surrounding structures were obtained without intravenous contrast. Angiographic images of the neck were obtained using MRA technique without intravenous contrast. COMPARISON:  Noncontrast head CT performed earlier today 11/14/2020. FINDINGS: MR HEAD FINDINGS Brain: Intermittently motion degraded examination. Most notably, there is mild-to-moderate motion degradation of the coronal T2 weighted sequence. Mild-to-moderate cerebral atrophy. Small acute cortical infarct within the left parietal lobe (series 5, image 9). Additional small curvilinear acute infarct within the left parietal white matter (for instance as seen on series 5, image 85). There are a few additional punctate acute infarcts within the left frontal lobe white matter (for instance as seen on series 5, image 86). Mild multifocal T2/FLAIR hyperintensity within the cerebral white matter and pons is nonspecific, but compatible with chronic small vessel ischemic disease. Tiny chronic infarct within  the right cerebellar hemisphere. Redemonstrated asymmetric CSF intensity prominence overlying the left frontal lobe. However, this appears to reflect asymmetric prominence of the subarachnoid space rather than a subdural collection. Unchanged 4 mm rightward midline shift, indeterminate in etiology. No evidence of intracranial mass. No chronic intracranial blood products. 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 Otherwise reported below. Skull and upper cervical spine: No focal marrow lesion. Sinuses/Orbits: Visualized orbits show no acute finding. Moderate right maxillary sinus mucosal thickening with associated chronic reactive osteitis. Trace bilateral ethmoid sinus mucosal thickening. 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 Reconstitution of flow related signal within the supraclinoid internal carotid arteries bilaterally. The M1 middle cerebral arteries are patent. Attenuated appearance of multiple proximal left M2 MCA branches. However, no left M2 proximal branch occlusion is identified. The anterior cerebral arteries are patent. Moderate stenosis within the A1 right ACA. No intracranial aneurysm is identified. The intracranial vertebral arteries are patent. The basilar artery is patent. Possible short segment fenestration within the proximal basilar artery. The posterior cerebral arteries are patent. Fetal origin right posterior cerebral artery. A small left posterior communicating artery is present. MRA NECK FINDINGS Mild intermittent motion degradation. The right CCA is patent to the bifurcation without significant stenosis. The right ICA is poorly delineated shortly beyond its origin, likely occluded or near occluded. The left CCA is poorly delineated shortly beyond its origin, likely occluded or near occluded. The left ICA is nonvisualized, likely  occluded or near  occluded. The dominant left vertebral artery is patent within the neck. Apparent moderate stenosis at the origin of this vessel. Flow related signal is seen intermittently within the non dominant cervical right vertebral artery. These results were called by telephone at the time of interpretation on 11/14/2020 at 11:06 pm to provider Dr. Wilford Corner, who verbally acknowledged these results. IMPRESSION: MRI brain: 1. Intermittently motion degraded examination. 2. Small acute infarcts within the left parietal cortex and left frontoparietal white matter, as described. 3. Background mild-to-moderate cerebral atrophy and mild chronic small vessel ischemic disease. 4. Tiny chronic right cerebellar infarct. 5. Redemonstrated asymmetric CSF intensity prominence overlying the left frontal lobe. This appears to reflect asymmetric prominence of the subarachnoid space rather than a subdural collection. MRA neck: 1. The right ICA is poorly delineated shortly beyond its origin, likely occluded or near occluded. 2. The left CCA is nonvisualized except for its very proximal portion. The cervical left ICA is also nonvisualized. These vessels are likely occluded or near occluded. 3. The dominant left vertebral artery is patent within the neck. Apparent moderate stenosis at the origin of this vessel. Flow related signal is seen intermittently within the non-dominant cervical right vertebral artery. MRA head: 1. 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 Reconstitution of the intracranial ICAs at the supraclinoid level. 2. Attenuated appearance of multiple proximal M2 left MCA branches. However, no left M2 proximal branch occlusion is identified. 3. Moderate stenosis within the A1 right ACA. Electronically Signed   By: Jackey Loge DO   On: 11/14/2020 23:10   MR ANGIO NECK WO CONTRAST  Result Date: 11/14/2020 CLINICAL DATA:  Neuro deficit, acute, stroke suspected. EXAM: MR  HEAD WITHOUT CONTRAST MR CIRCLE OF WILLIS WITHOUT CONTRAST MRA OF THE NECK WITHOUT CONTRAST TECHNIQUE: Multiplanar, multiecho pulse sequences of the brain, circle of willis and surrounding structures were obtained without intravenous contrast. Angiographic images of the neck were obtained using MRA technique without intravenous contrast. COMPARISON:  Noncontrast head CT performed earlier today 11/14/2020. FINDINGS: MR HEAD FINDINGS Brain: Intermittently motion degraded examination. Most notably, there is mild-to-moderate motion degradation of the coronal T2 weighted sequence. Mild-to-moderate cerebral atrophy. Small acute cortical infarct within the left parietal lobe (series 5, image 9). Additional small curvilinear acute infarct within the left parietal white matter (for instance as seen on series 5, image 85). There are a few additional punctate acute infarcts within the left frontal lobe white matter (for instance as seen on series 5, image 86). Mild multifocal T2/FLAIR hyperintensity within the cerebral white matter and pons is nonspecific, but compatible with chronic small vessel ischemic disease. Tiny chronic infarct within the right cerebellar hemisphere. Redemonstrated asymmetric CSF intensity prominence overlying the left frontal lobe. However, this appears to reflect asymmetric prominence of the subarachnoid space rather than a subdural collection. Unchanged 4 mm rightward midline shift, indeterminate in etiology. No evidence of intracranial mass. No chronic intracranial blood products. 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 Otherwise reported below. Skull and upper cervical spine: No focal marrow lesion. Sinuses/Orbits: Visualized orbits show no acute finding. Moderate right maxillary sinus mucosal thickening with associated chronic reactive osteitis. Trace bilateral ethmoid sinus mucosal thickening. MR CIRCLE OF WILLIS FINDINGS The intracranial  internal carotid arteries remain occluded or near occluded throughout the siphon region. Reconstitution of flow related signal within the supraclinoid internal carotid arteries bilaterally. The M1 middle cerebral arteries are patent. Attenuated appearance of  multiple proximal left M2 MCA branches. However, no left M2 proximal branch occlusion is identified. The anterior cerebral arteries are patent. Moderate stenosis within the A1 right ACA. No intracranial aneurysm is identified. The intracranial vertebral arteries are patent. The basilar artery is patent. Possible short segment fenestration within the proximal basilar artery. The posterior cerebral arteries are patent. Fetal origin right posterior cerebral artery. A small left posterior communicating artery is present. MRA NECK FINDINGS Mild intermittent motion degradation. The right CCA is patent to the bifurcation without significant stenosis. The right ICA is poorly delineated shortly beyond its origin, likely occluded or near occluded. The left CCA is poorly delineated shortly beyond its origin, likely occluded or near occluded. The left ICA is nonvisualized, likely occluded or near occluded. The dominant left vertebral artery is patent within the neck. Apparent moderate stenosis at the origin of this vessel. Flow related signal is seen intermittently within the non dominant cervical right vertebral artery. These results were called by telephone at the time of interpretation on 11/14/2020 at 11:06 pm to provider Dr. Wilford Corner, who verbally acknowledged these results. IMPRESSION: MRI brain: 1. Intermittently motion degraded examination. 2. Small acute infarcts within the left parietal cortex and left frontoparietal white matter, as described. 3. Background mild-to-moderate cerebral atrophy and mild chronic small vessel ischemic disease. 4. Tiny chronic right cerebellar infarct. 5. Redemonstrated asymmetric CSF intensity prominence overlying the left frontal lobe.  This appears to reflect asymmetric prominence of the subarachnoid space rather than a subdural collection. MRA neck: 1. The right ICA is poorly delineated shortly beyond its origin, likely occluded or near occluded. 2. The left CCA is nonvisualized except for its very proximal portion. The cervical left ICA is also nonvisualized. These vessels are likely occluded or near occluded. 3. The dominant left vertebral artery is patent within the neck. Apparent moderate stenosis at the origin of this vessel. Flow related signal is seen intermittently within the non-dominant cervical right vertebral artery. MRA head: 1. 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 Reconstitution of the intracranial ICAs at the supraclinoid level. 2. Attenuated appearance of multiple proximal M2 left MCA branches. However, no left M2 proximal branch occlusion is identified. 3. Moderate stenosis within the A1 right ACA. Electronically Signed   By: Jackey Loge DO   On: 11/14/2020 23:10   MR Brain Wo Contrast (neuro protocol)  Result Date: 11/14/2020 CLINICAL DATA:  Neuro deficit, acute, stroke suspected. EXAM: MR HEAD WITHOUT CONTRAST MR CIRCLE OF WILLIS WITHOUT CONTRAST MRA OF THE NECK WITHOUT CONTRAST TECHNIQUE: Multiplanar, multiecho pulse sequences of the brain, circle of willis and surrounding structures were obtained without intravenous contrast. Angiographic images of the neck were obtained using MRA technique without intravenous contrast. COMPARISON:  Noncontrast head CT performed earlier today 11/14/2020. FINDINGS: MR HEAD FINDINGS Brain: Intermittently motion degraded examination. Most notably, there is mild-to-moderate motion degradation of the coronal T2 weighted sequence. Mild-to-moderate cerebral atrophy. Small acute cortical infarct within the left parietal lobe (series 5, image 9). Additional small curvilinear acute infarct within the left parietal white matter  (for instance as seen on series 5, image 85). There are a few additional punctate acute infarcts within the left frontal lobe white matter (for instance as seen on series 5, image 86). Mild multifocal T2/FLAIR hyperintensity within the cerebral white matter and pons is nonspecific, but compatible with chronic small vessel ischemic disease. Tiny chronic infarct within the right cerebellar hemisphere. Redemonstrated asymmetric CSF intensity prominence overlying the  left frontal lobe. However, this appears to reflect asymmetric prominence of the subarachnoid space rather than a subdural collection. Unchanged 4 mm rightward midline shift, indeterminate in etiology. No evidence of intracranial mass. No chronic intracranial blood products. 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 Otherwise reported below. Skull and upper cervical spine: No focal marrow lesion. Sinuses/Orbits: Visualized orbits show no acute finding. Moderate right maxillary sinus mucosal thickening with associated chronic reactive osteitis. Trace bilateral ethmoid sinus mucosal thickening. 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 Reconstitution of flow related signal within the supraclinoid internal carotid arteries bilaterally. The M1 middle cerebral arteries are patent. Attenuated appearance of multiple proximal left M2 MCA branches. However, no left M2 proximal branch occlusion is identified. The anterior cerebral arteries are patent. Moderate stenosis within the A1 right ACA. No intracranial aneurysm is identified. The intracranial vertebral arteries are patent. The basilar artery is patent. Possible short segment fenestration within the proximal basilar artery. The posterior cerebral arteries are patent. Fetal origin right posterior cerebral artery. A small left posterior communicating artery is present. MRA  NECK FINDINGS Mild intermittent motion degradation. The right CCA is patent to the bifurcation without significant stenosis. The right ICA is poorly delineated shortly beyond its origin, likely occluded or near occluded. The left CCA is poorly delineated shortly beyond its origin, likely occluded or near occluded. The left ICA is nonvisualized, likely occluded or near occluded. The dominant left vertebral artery is patent within the neck. Apparent moderate stenosis at the origin of this vessel. Flow related signal is seen intermittently within the non dominant cervical right vertebral artery. These results were called by telephone at the time of interpretation on 11/14/2020 at 11:06 pm to provider Dr. Wilford Corner, who verbally acknowledged these results. IMPRESSION: MRI brain: 1. Intermittently motion degraded examination. 2. Small acute infarcts within the left parietal cortex and left frontoparietal white matter, as described. 3. Background mild-to-moderate cerebral atrophy and mild chronic small vessel ischemic disease. 4. Tiny chronic right cerebellar infarct. 5. Redemonstrated asymmetric CSF intensity prominence overlying the left frontal lobe. This appears to reflect asymmetric prominence of the subarachnoid space rather than a subdural collection. MRA neck: 1. The right ICA is poorly delineated shortly beyond its origin, likely occluded or near occluded. 2. The left CCA is nonvisualized except for its very proximal portion. The cervical left ICA is also nonvisualized. These vessels are likely occluded or near occluded. 3. The dominant left vertebral artery is patent within the neck. Apparent moderate stenosis at the origin of this vessel. Flow related signal is seen intermittently within the non-dominant cervical right vertebral artery. MRA head: 1. 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 Reconstitution of the intracranial ICAs at the supraclinoid  level. 2. Attenuated appearance of multiple proximal M2 left MCA branches. However, no left M2 proximal branch occlusion is identified. 3. Moderate stenosis within the A1 right ACA. Electronically Signed   By: Jackey Loge DO   On: 11/14/2020 23:10   DG Chest Portable 1 View  Result Date: 11/14/2020 CLINICAL DATA:  Pt states he woke up at 10:00 and his arms felt shaky. He went back to bed and woke up at 1400. He felt unbalanced and couldn't get out of bed and kept falling to his R sided. He was having a hard time with the coordination of his R arm and his L eye  was blurry. Per [facility], they stated pt's hr went from 40 - 110. EXAM: PORTABLE CHEST 1 VIEW COMPARISON:  None. FINDINGS: Cardiac silhouette is normal in size. No mediastinal or hilar masses. There are bilateral coarsely thickened interstitial markings. Subtle hazy opacity also noted most evident in the right mid to lower lung. No convincing pleural effusion.  No pneumothorax. Skeletal structures are grossly intact. IMPRESSION: 1. Coarsely thickened bilateral interstitial markings with some hazy type opacity most evident in the right mid to lower lung. These findings may all be chronic. Consider multifocal infection particularly on the right, in the proper clinical setting. Electronically Signed   By: Amie Portland M.D.   On: 11/14/2020 17:04   CT ANGIO CHEST AORTA W/CM & OR WO/CM  Result Date: 11/17/2020 CLINICAL DATA:  Suspected pulmonary arteriovenous malformation. Left MCA territory infarct. EXAM: CT ANGIOGRAPHY CHEST TECHNIQUE: Multidetector CT imaging through the chest was performed using the standard protocol during bolus administration of intravenous contrast. Multiplanar reconstructed images and MIPs were obtained and reviewed to evaluate the vascular anatomy. CONTRAST:  55mL OMNIPAQUE IOHEXOL 350 MG/ML SOLN COMPARISON:  Chest CT 11/15/2020 and CT a head and neck 11/15/2020 FINDINGS: CTA CHEST FINDINGS Cardiovascular: Contrast is predominantly  in the thoracic aorta and there is poor opacification of pulmonary arteries. As a result, limited evaluation for a pulmonary arteriovenous malformation. There are coronary artery calcifications. Atherosclerotic calcifications involving the thoracic aorta. Ascending thoracic aorta measures up to 3.7 cm. Typical three-vessel arch anatomy. However, the left common carotid artery occludes just beyond the origin and similar to the neck CTA findings. Brachiocephalic artery is patent with atherosclerotic disease. The right subclavian artery and right common carotid artery are patent. Left subclavian artery is patent with mild narrowing near the origin and proximal aspect. Proximal left vertebral artery is patent. Origin of the right vertebral artery is occluded with distal reconstitution. Right vertebral artery findings are similar to the recent CTA neck. Proximal descending thoracic aorta measures 2.7 cm. Heart size is slightly prominent but no significant pericardial effusion. Mediastinum/Nodes: Enlarged prevascular lymph nodes measuring up to 1.5 cm in the short axis on sequence 9, image 2. Enlarged pretracheal lymph node with central necrosis on sequence 10 image 46 measuring 2.3 cm in short axis. Small supraclavicular lymph nodes. No significant axillary lymph node enlargement. Prominent left hilar tissue on sequence 10 image 59 measures 1.1 cm in the short axis. Slightly prominent right hilar tissue is difficult to evaluate due to lack of contrast in the pulmonary arteries. Esophagus is unremarkable. No focal abnormality involving the thyroid tissue. Lungs/Pleura: Trachea and mainstem bronchi are patent. Scattered areas of emphysema in the upper lobes. Two adjacent poorly defined nodules in the left upper lobe best seen on the coronal reformats, sequence 7, images 96 and 95. These nodules measure roughly 8 mm and 6 mm. These nodules are also seen on the axial images on sequence 9 image 31. Scattered areas of  interstitial thickening. No large pleural effusions. Hazy parenchymal densities in both lower lobes could represent atelectasis and/or edema. Parenchymal lung findings are similar to the exam on 11/15/2020. Upper abdomen: Multiple calcified gallstones. Musculoskeletal: Multilevel degenerative changes in the thoracic spine. No suspicious bone lesions. Review of the MIP images confirms the above findings. IMPRESSION: 1. Mediastinal and hilar lymphadenopathy of unknown etiology. 2. Patchy areas of interstitial thickening with hazy densities in lower lungs. Differential diagnosis includes asymmetric pulmonary edema and/or atypical infection. 3. Two poorly defined nodules in the left upper lobe, largest  measuring 8 mm. Based on the configuration, these nodules could be inflammatory or infectious but indeterminate. Non-contrast chest CT at 3-6 months is recommended. If the nodules are stable at time of repeat CT, then future CT at 18-24 months (from today's scan) is considered optional for low-risk patients, but is recommended for high-risk patients. This recommendation follows the consensus statement: Guidelines for Management of Incidental Pulmonary Nodules Detected on CT Images: From the Fleischner Society 2017; Radiology 2017; 284:228-243. 4. Aortic Atherosclerosis (ICD10-I70.0) and Emphysema (ICD10-J43.9). 5. Pulmonary arteriovenous malformation cannot be evaluated because the pulmonary arteries are not opacified on this examination. 6. 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 These vascular findings are similar to the recent neck CTA. 7. Coronary artery calcifications. 8. Cholelithiasis. Electronically Signed   By: Richarda Overlie M.D.   On: 11/17/2020 08:10   ECHOCARDIOGRAM COMPLETE BUBBLE STUDY  Result Date: 11/16/2020    ECHOCARDIOGRAM REPORT   Patient Name:   Martin Collins Date of Exam: 11/15/2020 Medical Rec  #:  [id number]   Height:       75.0 in Accession #:    [id number]  Weight:       251.5 lb Date of Birth:  04/15/1934  BSA:          2.419 m Patient Age:    86 years    BP:           175/6 mmHg Patient Gender: M           HR:           105 bpm. Exam Location:  Inpatient Procedure: 2D Echo, Cardiac Doppler, Color Doppler, Intracardiac Opacification            Agent and Saline Contrast Bubble Study Indications:     Stroke  History:         Patient has no prior history of Echocardiogram examinations.                  Elevated troponin.  Sonographer:     Sheralyn Boatman RDCS Referring Phys:  [id number] Angie Fava Diagnosing Phys: Marca Ancona MD  Sonographer Comments: Technically difficult study due to poor echo windows. Patient heart rate erratic during exam. Patient moving during study. IMPRESSIONS  1. Left ventricular ejection fraction, by estimation, is 25-30%. The left ventricle has severely decreased function. The left ventricle demonstrates severe global hypokinesis with inferolateral akinesis. The left ventricular internal cavity size was mildly dilated. There is mild left ventricular hypertrophy. Left ventricular diastolic parameters are consistent with Grade II diastolic dysfunction (pseudonormalization).  2. Right ventricular systolic function is normal. The right ventricular size is normal. Tricuspid regurgitation signal is inadequate for assessing PA pressure.  3. Left atrial size was mildly dilated.  4. The mitral valve is degenerative. Mild mitral valve regurgitation. No evidence of mitral stenosis. Moderate mitral annular calcification.  5. The aortic valve is tricuspid. Aortic valve regurgitation is not visualized. Mild aortic valve sclerosis is present, with no evidence of aortic valve stenosis.  6. Bubble study appears positive after 8-9 beats, but review of study shows that bubble study was done after echo contrast was given, so suspect this represents contrast being flushed through pulmonary veins.  Would repeat limited echo with bubble study to be definitive.  7. The IVC was poorly visualized. FINDINGS  Left Ventricle: Left ventricular ejection fraction, by estimation, is 25 to 30%. The left  ventricle has severely decreased function. The left ventricle demonstrates global hypokinesis. Definity contrast agent was given IV to delineate the left ventricular endocardial borders. The left ventricular internal cavity size was mildly dilated. There is mild left ventricular hypertrophy. Left ventricular diastolic parameters are consistent with Grade II diastolic dysfunction (pseudonormalization). Right Ventricle: The right ventricular size is normal. No increase in right ventricular wall thickness. Right ventricular systolic function is normal. Tricuspid regurgitation signal is inadequate for assessing PA pressure. Left Atrium: Left atrial size was mildly dilated. Right Atrium: Right atrial size was normal in size. Pericardium: There is no evidence of pericardial effusion. Mitral Valve: The mitral valve is degenerative in appearance. There is mild calcification of the mitral valve leaflet(s). Moderate mitral annular calcification. Mild mitral valve regurgitation. No evidence of mitral valve stenosis. Tricuspid Valve: The tricuspid valve is normal in structure. Tricuspid valve regurgitation is not demonstrated. Aortic Valve: The aortic valve is tricuspid. Aortic valve regurgitation is not visualized. Mild aortic valve sclerosis is present, with no evidence of aortic valve stenosis. Pulmonic Valve: The pulmonic valve was normal in structure. Pulmonic valve regurgitation is not visualized. Aorta: The aortic root is normal in size and structure. Venous: The inferior vena cava was not well visualized. IAS/Shunts: Bubble study markedly positive but after 9 beats. ?large pulmonary AVM. Agitated saline contrast was given intravenously to evaluate for intracardiac shunting.  LEFT VENTRICLE PLAX 2D LVIDd:         5.95 cm LVIDs:          5.35 cm LV PW:         1.65 cm LV IVS:        1.60 cm LVOT diam:     2.80 cm LV SV:         153 LV SV Index:   63 LVOT Area:     6.16 cm  LV Volumes (MOD) LV vol d, MOD A2C: 193.5 ml LV vol d, MOD A4C: 218.5 ml LV vol s, MOD A2C: 117.4 ml LV vol s, MOD A4C: 173.0 ml LV SV MOD A2C:     76.1 ml LV SV MOD A4C:     218.5 ml LV SV MOD BP:      61.7 ml RIGHT VENTRICLE RV S prime:     11.60 cm/s TAPSE (M-mode): 1.8 cm LEFT ATRIUM           Index       RIGHT ATRIUM           Index LA diam:      4.70 cm 1.94 cm/m  RA Area:     13.10 cm LA Vol (A2C): 37.6 ml 15.54 ml/m RA Volume:   32.90 ml  13.60 ml/m LA Vol (A4C): 63.9 ml 26.42 ml/m  AORTIC VALVE LVOT Vmax:   97.50 cm/s LVOT Vmean:  80.500 cm/s LVOT VTI:    0.248 m  AORTA Ao Root diam: 3.70 cm MITRAL VALVE MV Area (PHT): 3.65 cm    SHUNTS MV Decel Time: 208 msec    Systemic VTI:  0.25 m MV E velocity: 96.00 cm/s  Systemic Diam: 2.80 cm MV A velocity: 76.85 cm/s MV E/A ratio:  1.25 Marca Ancona MD Electronically signed by Marca Ancona MD Signature Date/Time: 11/15/2020/4:55:51 PM    Final (Updated)    CT HEAD CODE STROKE WO CONTRAST  Result Date: 11/14/2020 CLINICAL DATA:  Code stroke. Neuro deficit, acute, stroke suspected. Additional history provided: Left eye blurriness, right arm sensory and coordination. EXAM: CT HEAD  WITHOUT CONTRAST TECHNIQUE: Contiguous axial images were obtained from the base of the skull through the vertex without intravenous contrast. COMPARISON:  No pertinent prior exams available for comparison. FINDINGS: Brain: Mild cerebral atrophy. Asymmetric extra-axial CSF density overlying the left cerebral hemisphere measuring up to 6 mm in thickness. This may be related to patient head positioning at the time of examination. A chronic subdural hematoma or subdural hygroma cannot be excluded. 4 mm rightward midline shift. Again, this may be related to patient head positioning or mass effect from a left subdural collection. Mild  ill-defined hypoattenuation within the cerebral white matter, nonspecific, but compatible with chronic small vessel ischemic disease. There is no acute intracranial hemorrhage. No demarcated cortical infarct. No evidence of intracranial mass. No midline shift. Vascular: No hyperdense vessel.  Atherosclerotic calcifications. Skull: Normal. Negative for fracture or focal lesion. Sinuses/Orbits: Visualized orbits show no acute finding. Moderate mucosal thickening within the right maxillary sinus with associated chronic reactive osteitis. Mild right frontal and bilateral ethmoid sinus mucosal thickening. ASPECTS [facility] Early CT Score) - Ganglionic level infarction (caudate, lentiform nuclei, internal capsule, insula, M1-M3 cortex): 7 - Supraganglionic infarction (M4-M6 cortex): 3 Total score (0-10 with 10 being normal): 10 These results were communicated to Dr. Otelia Limes At 4:31 pmon 2/13/2022by text page via the [facility] messaging system. IMPRESSION: No evidence of acute intracranial hemorrhage or acute infarct. ASPECTS is 10. Asymmetric extra-axial CSF density overlying the left cerebral hemisphere measuring up to 6 mm in thickness. This may be related to patient head positioning at the time of the examination. A chronic subdural hematoma or subdural hygroma cannot be excluded. 4 mm rightward midline shift. Again, this may be related to patient head positioning or mass effect from a left subdural collection. Mild cerebral atrophy and chronic small vessel ischemic disease. Paranasal sinus disease, most notably chronic right maxillary sinusitis. Electronically Signed   By: Jackey Loge DO   On: 11/14/2020 16:33   VAS US CAROTID (at [facility] and [facility] only)  Result Date: 11/15/2020 Carotid Arterial Duplex Study Indications:       TIA, visual disturbance LT, weakness RT. Risk Factors:      Past history of smoking. Other Factors:     Limited medical history available- patient reports he has not                     seen physician in approximately 30 years. Limitations        Today's exam was limited due to the patient's inability or                    unwillingness to cooperate, the high bifurcation of the                    carotid and the patient's respiratory variation. Comparison Study:  11-15-2020 CTA neck showed RT ICA occlusion, RT ECA stenosis,                    RT vertebral occlusion, LT CCA occlusion, and LT ICA                    occlusion. Performing Technologist: Jean Rosenthal RDMS  Examination Guidelines: A complete evaluation includes B-mode imaging, spectral Doppler, color Doppler, and power Doppler as needed of all accessible portions of each vessel. Bilateral testing is considered an integral part of a complete examination. Limited examinations for reoccurring indications may be performed as noted.  Right Carotid Findings: +----------+--------+--------+--------+------------------+---------------------+           PSV cm/sEDV cm/sStenosisPlaque DescriptionComments              +----------+--------+--------+--------+------------------+---------------------+ CCA Prox  101     11                                intimal thickening    +----------+--------+--------+--------+------------------+---------------------+ CCA Distal42      13                                intimal thickening    +----------+--------+--------+--------+------------------+---------------------+ ICA Prox                  Occluded                                        +----------+--------+--------+--------+------------------+---------------------+ ICA Mid                   Occluded                                        +----------+--------+--------+--------+------------------+---------------------+ ICA Distal                                          Not visualized        +----------+--------+--------+--------+------------------+---------------------+ ECA       600             >50%    heterogenous       Nyquist limit-                                                            velocities greater                                                        than 600              +----------+--------+--------+--------+------------------+---------------------+ +----------+--------+-------+--------+-------------------+           PSV cm/sEDV cmsDescribeArm Pressure (mmHG) +----------+--------+-------+--------+-------------------+ [id number]            Stenotic                    +----------+--------+-------+--------+-------------------+ +---------+--------+--------+--------------+ VertebralPSV cm/sEDV cm/sNot identified +---------+--------+--------+--------------+  Left Carotid Findings: +----------+-------+--------+--------+----------------+-----------------------+           PSV    EDV cm/sStenosisPlaque          Comments                          cm/s                   Description                             +----------+-------+--------+--------+----------------+-----------------------+  CCA Prox  47                                     Near occlusion- slow                                                     flow                    +----------+-------+--------+--------+----------------+-----------------------+ CCA Mid   42                                     Near occlusion- slow                                                     flow                    +----------+-------+--------+--------+----------------+-----------------------+ CCA Distal               Occludedhypoechoic                              +----------+-------+--------+--------+----------------+-----------------------+ ICA Prox                 Occludedhyperechoic                             +----------+-------+--------+--------+----------------+-----------------------+ ICA Mid                  Occludedhyperechoic                              +----------+-------+--------+--------+----------------+-----------------------+ ICA Distal                                       Not visualized          +----------+-------+--------+--------+----------------+-----------------------+ ECA       137    28                                                      +----------+-------+--------+--------+----------------+-----------------------+ +----------+--------+--------+----------------+-------------------+           PSV cm/sEDV cm/sDescribe        Arm Pressure (mmHG) +----------+--------+--------+----------------+-------------------+ [id number]             Multiphasic, WNL                    +----------+--------+--------+----------------+-------------------+ +---------+--------+---+--------+--+---------+ VertebralPSV cm/s123EDV cm/s51Antegrade +---------+--------+---+--------+--+---------+   Summary: 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 The                ECA appears >50% stenosed. Left Carotid: Evidence consistent  with near occlusion of the left ICA. The CCA               appears occluded. The ECA appears <50% stenosed. Vertebrals:  Left vertebral artery demonstrates antegrade flow. Right vertebral              artery was not visualized. Subclavians: Normal flow hemodynamics were seen in bilateral subclavian              arteries. *See table(s) above for measurements and observations.  Electronically signed by Delia Heady MD on 11/15/2020 at 12:18:20 PM.    Final       The results of significant diagnostics from this hospitalization (including imaging, microbiology, ancillary and laboratory) are listed below for reference.     Microbiology: Recent Results (from the past 240 hour(s))  SARS CORONAVIRUS 2 (TAT 6-24 HRS) Nasopharyngeal Nasopharyngeal Swab     Status: None   Collection Time: 11/14/20  5:20 PM   Specimen: Nasopharyngeal Swab  Result Value Ref Range Status   SARS Coronavirus  2 NEGATIVE NEGATIVE Final    Comment: (NOTE) SARS-CoV-2 target nucleic acids are NOT DETECTED.  The SARS-CoV-2 RNA is generally detectable in upper and lower respiratory specimens during the acute phase of infection. Negative results do not preclude SARS-CoV-2 infection, do not rule out co-infections with other pathogens, and should not be used as the sole basis for treatment or other patient management decisions. Negative results must be combined with clinical observations, patient history, and epidemiological information. The expected result is Negative.  Fact Sheet for Patients: HairSlick.no  Fact Sheet for Healthcare Providers: quierodirigir.com  This test is not yet approved or cleared by the [place] FDA and  has been authorized for detection and/or diagnosis of SARS-CoV-2 by FDA under an Emergency Use Authorization (EUA). This EUA will remain  in effect (meaning this test can be used) for the duration of the COVID-19 declaration under Se ction 564(b)(1) of the Act, 21 U.S.C. section 360bbb-3(b)(1), unless the authorization is terminated or revoked sooner.  Performed at [facility], [address], [place], [place] [address]      Labs: BNP (last 3 results) Recent Labs    11/14/20 1618  BNP 480.2*   Basic Metabolic Panel: Recent Labs  Lab 11/14/20 1610 11/14/20 1614 11/15/20 0351  NA 140 140 139  K 4.1 4.0 4.4  CL 105 107 108  CO2 23  --  19*  GLUCOSE 123* 112* 98  BUN CREATININE 1.63* 1.50* 1.43*  CALCIUM 9.3  --  9.1  MG  --   --  2.0   Liver Function Tests: Recent Labs  Lab 11/14/20 1610  AST 31  ALT 14  ALKPHOS 78  BILITOT 1.2  PROT 7.3  ALBUMIN 3.8   No results for input(s): LIPASE, AMYLASE in the last 168 hours. Recent Labs  Lab 11/14/20 1701  AMMONIA 26   CBC: Recent Labs  Lab 11/14/20 1610 11/14/20 1614 11/15/20 0414  WBC 8.4  --  9.4  NEUTROABS 5.0   --   --   HGB 15.2 16.0 12.9*  HCT 46.8 47.0 39.3  MCV 94.7  --  95.2  PLT 191  --  149*   Cardiac Enzymes: No results for input(s): CKTOTAL, CKMB, CKMBINDEX, TROPONINI in the last 168 hours. BNP: Invalid input(s): POCBNP CBG: Recent Labs  Lab 11/14/20 1608 11/16/20 1011 11/16/20 1532  GLUCAP 116* 94 108*   D-Dimer No results for input(s):  DDIMER in the last 72 hours. Hgb A1c Recent Labs    11/15/20 0414  HGBA1C 4.8   Lipid Profile Recent Labs    11/15/20 0351  CHOL 156  HDL 52  LDLCALC 92  TRIG 58  CHOLHDL 3.0   Thyroid function studies No results for input(s): TSH, T4TOTAL, T3FREE, THYROIDAB in the last 72 hours.  Invalid input(s): FREET3 Anemia work up No results for input(s): VITAMINB12, FOLATE, FERRITIN, TIBC, IRON, RETICCTPCT in the last 72 hours. Urinalysis    Component Value Date/Time   COLORURINE YELLOW 11/14/2020 2138   APPEARANCEUR CLEAR 11/14/2020 2138   LABSPEC >1.030 (H) 11/14/2020 2138   PHURINE 5.5 11/14/2020 2138   GLUCOSEU NEGATIVE 11/14/2020 2138   HGBUR TRACE (A) 11/14/2020 2138   BILIRUBINUR NEGATIVE 11/14/2020 2138   KETONESUR NEGATIVE 11/14/2020 2138   PROTEINUR 100 (A) 11/14/2020 2138   NITRITE NEGATIVE 11/14/2020 2138   LEUKOCYTESUR NEGATIVE 11/14/2020 2138   Sepsis Labs Invalid input(s): PROCALCITONIN,  WBC,  LACTICIDVEN Microbiology Recent Results (from the past 240 hour(s))  SARS CORONAVIRUS 2 (TAT 6-24 HRS) Nasopharyngeal Nasopharyngeal Swab     Status: None   Collection Time: 11/14/20  5:20 PM   Specimen: Nasopharyngeal Swab  Result Value Ref Range Status   SARS Coronavirus 2 NEGATIVE NEGATIVE Final    Comment: (NOTE) SARS-CoV-2 target nucleic acids are NOT DETECTED.  The SARS-CoV-2 RNA is generally detectable in upper and lower respiratory specimens during the acute phase of infection. Negative results do not preclude SARS-CoV-2 infection, do not rule out co-infections with other pathogens, and should not be used  as the sole basis for treatment or other patient management decisions. Negative results must be combined with clinical observations, patient history, and epidemiological information. The expected result is Negative.  Fact Sheet for Patients: HairSlick.no  Fact Sheet for Healthcare Providers: quierodirigir.com  This test is not yet approved or cleared by the [place] FDA and  has been authorized for detection and/or diagnosis of SARS-CoV-2 by FDA under an Emergency Use Authorization (EUA). This EUA will remain  in effect (meaning this test can be used) for the duration of the COVID-19 declaration under Se ction 564(b)(1) of the Act, 21 U.S.C. section 360bbb-3(b)(1), unless the authorization is terminated or revoked sooner.  Performed at [facility], [address] [place], [place] [address]      Time coordinating discharge in minutes: 65  SIGNED:   Calvert Cantor, MD  [facility] 11/17/2020, 3:34 PM

## 2020-11-30 DEATH — deceased
